# Patient Record
Sex: Female | Born: 1935
Health system: Southern US, Community
[De-identification: ages and names within clinical notes are randomized; demographics above are authoritative.]

## PROBLEM LIST (undated history)

## (undated) DIAGNOSIS — M1990 Unspecified osteoarthritis, unspecified site: Secondary | ICD-10-CM

## (undated) DIAGNOSIS — H469 Unspecified optic neuritis: Secondary | ICD-10-CM

## (undated) DIAGNOSIS — R413 Other amnesia: Secondary | ICD-10-CM

## (undated) DIAGNOSIS — I10 Essential (primary) hypertension: Secondary | ICD-10-CM

## (undated) DIAGNOSIS — G35 Multiple sclerosis: Secondary | ICD-10-CM

## (undated) DIAGNOSIS — R42 Dizziness and giddiness: Secondary | ICD-10-CM

## (undated) DIAGNOSIS — R3915 Urgency of urination: Secondary | ICD-10-CM

## (undated) HISTORY — PX: JOINT REPLACEMENT: SHX530

## (undated) HISTORY — PX: TONSILLECTOMY: SUR1361

## (undated) HISTORY — PX: COLECTOMY: SHX59

## (undated) HISTORY — DX: Dizziness and giddiness: R42

## (undated) HISTORY — DX: Other amnesia: R41.3

## (undated) HISTORY — DX: Essential (primary) hypertension: I10

## (undated) HISTORY — DX: Multiple sclerosis: G35

## (undated) HISTORY — DX: Unspecified optic neuritis: H46.9

---

## 1971-01-22 HISTORY — PX: VAGINAL HYSTERECTOMY: SUR661

## 1978-09-22 HISTORY — PX: COLECTOMY: SHX59

## 1992-01-22 HISTORY — PX: KNEE ARTHROSCOPY: SHX127

## 1997-05-25 ENCOUNTER — Ambulatory Visit (HOSPITAL_COMMUNITY): Admission: RE | Admit: 1997-05-25 | Discharge: 1997-05-25 | Payer: Self-pay | Admitting: Obstetrics and Gynecology

## 1998-06-23 ENCOUNTER — Encounter: Payer: Self-pay | Admitting: Obstetrics and Gynecology

## 1998-06-23 ENCOUNTER — Ambulatory Visit (HOSPITAL_COMMUNITY): Admission: RE | Admit: 1998-06-23 | Discharge: 1998-06-23 | Payer: Self-pay | Admitting: Obstetrics and Gynecology

## 1999-06-26 ENCOUNTER — Encounter: Payer: Self-pay | Admitting: Obstetrics and Gynecology

## 1999-06-26 ENCOUNTER — Ambulatory Visit (HOSPITAL_COMMUNITY): Admission: RE | Admit: 1999-06-26 | Discharge: 1999-06-26 | Payer: Self-pay | Admitting: Obstetrics and Gynecology

## 2000-06-12 ENCOUNTER — Other Ambulatory Visit: Admission: RE | Admit: 2000-06-12 | Discharge: 2000-06-12 | Payer: Self-pay | Admitting: Obstetrics and Gynecology

## 2000-07-01 ENCOUNTER — Ambulatory Visit (HOSPITAL_COMMUNITY): Admission: RE | Admit: 2000-07-01 | Discharge: 2000-07-01 | Payer: Self-pay | Admitting: Obstetrics and Gynecology

## 2000-07-01 ENCOUNTER — Encounter: Payer: Self-pay | Admitting: Obstetrics and Gynecology

## 2001-02-11 ENCOUNTER — Inpatient Hospital Stay (HOSPITAL_COMMUNITY): Admission: AC | Admit: 2001-02-11 | Discharge: 2001-02-17 | Payer: Self-pay

## 2001-02-11 ENCOUNTER — Encounter: Payer: Self-pay | Admitting: Emergency Medicine

## 2001-02-11 ENCOUNTER — Encounter: Payer: Self-pay | Admitting: General Surgery

## 2001-02-12 ENCOUNTER — Encounter: Payer: Self-pay | Admitting: General Surgery

## 2001-07-06 ENCOUNTER — Ambulatory Visit (HOSPITAL_COMMUNITY): Admission: RE | Admit: 2001-07-06 | Discharge: 2001-07-06 | Payer: Self-pay | Admitting: Obstetrics and Gynecology

## 2001-07-06 ENCOUNTER — Encounter: Payer: Self-pay | Admitting: Obstetrics and Gynecology

## 2002-07-08 ENCOUNTER — Encounter: Payer: Self-pay | Admitting: Obstetrics and Gynecology

## 2002-07-08 ENCOUNTER — Ambulatory Visit (HOSPITAL_COMMUNITY): Admission: RE | Admit: 2002-07-08 | Discharge: 2002-07-08 | Payer: Self-pay | Admitting: Obstetrics and Gynecology

## 2003-07-28 ENCOUNTER — Ambulatory Visit (HOSPITAL_COMMUNITY): Admission: RE | Admit: 2003-07-28 | Discharge: 2003-07-28 | Payer: Self-pay | Admitting: Obstetrics and Gynecology

## 2004-03-28 ENCOUNTER — Ambulatory Visit: Payer: Self-pay | Admitting: Hematology and Oncology

## 2004-05-30 ENCOUNTER — Ambulatory Visit (HOSPITAL_COMMUNITY): Admission: RE | Admit: 2004-05-30 | Discharge: 2004-05-30 | Payer: Self-pay | Admitting: *Deleted

## 2004-08-15 ENCOUNTER — Ambulatory Visit (HOSPITAL_COMMUNITY): Admission: RE | Admit: 2004-08-15 | Discharge: 2004-08-15 | Payer: Self-pay | Admitting: Obstetrics and Gynecology

## 2005-08-19 ENCOUNTER — Ambulatory Visit (HOSPITAL_COMMUNITY): Admission: RE | Admit: 2005-08-19 | Discharge: 2005-08-19 | Payer: Self-pay | Admitting: Internal Medicine

## 2006-08-22 ENCOUNTER — Ambulatory Visit (HOSPITAL_COMMUNITY): Admission: RE | Admit: 2006-08-22 | Discharge: 2006-08-22 | Payer: Self-pay | Admitting: Internal Medicine

## 2007-02-26 ENCOUNTER — Encounter: Admission: RE | Admit: 2007-02-26 | Discharge: 2007-02-26 | Payer: Self-pay | Admitting: Internal Medicine

## 2007-09-04 ENCOUNTER — Ambulatory Visit (HOSPITAL_COMMUNITY): Admission: RE | Admit: 2007-09-04 | Discharge: 2007-09-04 | Payer: Self-pay | Admitting: Internal Medicine

## 2007-11-26 ENCOUNTER — Encounter: Admission: RE | Admit: 2007-11-26 | Discharge: 2007-11-26 | Payer: Self-pay | Admitting: Internal Medicine

## 2008-04-04 ENCOUNTER — Encounter (INDEPENDENT_AMBULATORY_CARE_PROVIDER_SITE_OTHER): Payer: Self-pay | Admitting: *Deleted

## 2008-04-04 ENCOUNTER — Ambulatory Visit (HOSPITAL_COMMUNITY): Admission: RE | Admit: 2008-04-04 | Discharge: 2008-04-04 | Payer: Self-pay | Admitting: *Deleted

## 2008-05-25 ENCOUNTER — Encounter: Admission: RE | Admit: 2008-05-25 | Discharge: 2008-05-25 | Payer: Self-pay | Admitting: Internal Medicine

## 2008-06-13 ENCOUNTER — Encounter: Admission: RE | Admit: 2008-06-13 | Discharge: 2008-06-13 | Payer: Self-pay | Admitting: *Deleted

## 2008-09-05 ENCOUNTER — Ambulatory Visit (HOSPITAL_COMMUNITY): Admission: RE | Admit: 2008-09-05 | Discharge: 2008-09-05 | Payer: Self-pay | Admitting: Internal Medicine

## 2009-03-02 ENCOUNTER — Other Ambulatory Visit: Admission: RE | Admit: 2009-03-02 | Discharge: 2009-03-02 | Payer: Self-pay | Admitting: Internal Medicine

## 2009-09-08 ENCOUNTER — Ambulatory Visit (HOSPITAL_COMMUNITY): Admission: RE | Admit: 2009-09-08 | Discharge: 2009-09-08 | Payer: Self-pay | Admitting: Internal Medicine

## 2010-01-21 HISTORY — PX: KNEE ARTHROSCOPY: SHX127

## 2010-06-05 NOTE — Op Note (Signed)
Jennifer Atkins, LAPRISE NO.:  [id number]   MEDICAL RECORD NO.:  [id number]          PATIENT TYPE:  AMB   LOCATION:  ENDO                         FACILITY:  [facility]   PHYSICIAN:  Georgiana Spinner, M.D.    DATE OF BIRTH:  1934-04-30   DATE OF PROCEDURE:  04/04/2008  DATE OF DISCHARGE:                               OPERATIVE REPORT   PROCEDURE:  Flexible sigmoidoscopy with biopsy.   INDICATIONS:  Rectal bleeding.   ANESTHESIA:  None given.   PROCEDURE:  With the patient mildly sedated in the left lateral  decubitus position. The Pentax videoscopic colonoscope was inserted in  the rectum and passed under direct vision to approximately 25 cm from  anal verge at which point we stopped.  The patient had previously agreed  to only do a flexible sigmoidoscopy.  From this point the colonoscope  was slowly withdrawn, taking circumferential views of colonic mucosa,  stopping in the rectum where a polyp was seen, photographed and removed  using biopsy forceps technique.  It appeared that we removed the whole  tissue. At this point the endoscope was then placed in retroflexion to  view the anal canal from above. Hemorrhoids were noted.  The endoscope  was straightened and withdrawn.  The patient's vital signs, pulse  oximeter remained stable.  The patient tolerated procedure well without  apparent complication.   FINDINGS:  Hemorrhoids and polyp in the rectum.  Await biopsy report.  The patient will call me for results and follow-up with me as an  outpatient.  Barium enema precluded because of biopsy.  We discussed  this with the patient during the procedure. She was awake without  sedation.           ______________________________  Georgiana Spinner, M.D.     GMO/MEDQ  D:  04/04/2008  T:  04/04/2008  Job:  [id number]

## 2010-06-08 NOTE — Op Note (Signed)
Jennifer Atkins, TEEHAN NO.:  [id number]   MEDICAL RECORD NO.:  [id number]          PATIENT TYPE:  AMB   LOCATION:  ENDO                         FACILITY:  [facility]   PHYSICIAN:  Georgiana Spinner, M.D.    DATE OF BIRTH:  March 04, 1934   DATE OF PROCEDURE:  05/30/2004  DATE OF DISCHARGE:                                 OPERATIVE REPORT   PROCEDURES:  Flexible sigmoidoscopy.   INDICATIONS:  Colon cancer screening.   SEDATION:  No sedation was given at the patient's request   DESCRIPTION OF PROCEDURE:  With the patient in the left lateral decubitus  position, the Olympus videoscopic colonoscope was inserted in the rectum and  passed under direct vision to approximately 50 cm from anal verge.  We could  see proximally a distance and no lesions were noted. From this point, the  colonoscope was slowly withdrawn taking circumferential views of colonic  mucosa; stopping in the rectum, which appeared normal on direct and showed  hemorrhoids on retroflexed view.  The endoscope was straightened and  withdrawn. The patient's vital signs, pulse oximetry remained stable. The  patient tolerated procedure well without apparent complication.   FINDINGS:  Internal hemorrhoids.  Rare diverticulum of sigmoid colon;  otherwise an unremarkable examination.   PLAN:  We discussed colonoscopy, which the patient absolutely refuses,  versus barium enema versus doing nothing.  The patient has agreed to proceed  with barium enema to look at the rest of the colon.      GMO/MEDQ  D:  05/30/2004  T:  05/30/2004  Job:  [id number]
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
## 2010-08-07 ENCOUNTER — Other Ambulatory Visit (HOSPITAL_COMMUNITY): Payer: Self-pay | Admitting: Internal Medicine

## 2010-08-07 DIAGNOSIS — Z1231 Encounter for screening mammogram for malignant neoplasm of breast: Secondary | ICD-10-CM

## 2010-09-14 ENCOUNTER — Ambulatory Visit (HOSPITAL_COMMUNITY)
Admission: RE | Admit: 2010-09-14 | Discharge: 2010-09-14 | Disposition: A | Discharge status: Home / Self Care | Payer: Medicare Other | Source: Ambulatory Visit | Attending: Internal Medicine | Admitting: Internal Medicine

## 2010-09-14 DIAGNOSIS — Z1231 Encounter for screening mammogram for malignant neoplasm of breast: Secondary | ICD-10-CM | POA: Insufficient documentation

## 2011-01-22 HISTORY — PX: CATARACT EXTRACTION W/ INTRAOCULAR LENS  IMPLANT, BILATERAL: SHX1307

## 2011-01-31 DIAGNOSIS — I10 Essential (primary) hypertension: Secondary | ICD-10-CM | POA: Diagnosis not present

## 2011-03-28 DIAGNOSIS — R350 Frequency of micturition: Secondary | ICD-10-CM | POA: Diagnosis not present

## 2011-03-28 DIAGNOSIS — I10 Essential (primary) hypertension: Secondary | ICD-10-CM | POA: Diagnosis not present

## 2011-03-28 DIAGNOSIS — Z7902 Long term (current) use of antithrombotics/antiplatelets: Secondary | ICD-10-CM | POA: Diagnosis not present

## 2011-04-03 DIAGNOSIS — R350 Frequency of micturition: Secondary | ICD-10-CM | POA: Diagnosis not present

## 2011-04-03 DIAGNOSIS — E042 Nontoxic multinodular goiter: Secondary | ICD-10-CM | POA: Diagnosis not present

## 2011-04-03 DIAGNOSIS — I10 Essential (primary) hypertension: Secondary | ICD-10-CM | POA: Diagnosis not present

## 2011-05-15 DIAGNOSIS — R350 Frequency of micturition: Secondary | ICD-10-CM | POA: Diagnosis not present

## 2011-05-15 DIAGNOSIS — I10 Essential (primary) hypertension: Secondary | ICD-10-CM | POA: Diagnosis not present

## 2011-08-15 ENCOUNTER — Other Ambulatory Visit (HOSPITAL_COMMUNITY): Payer: Self-pay | Admitting: Internal Medicine

## 2011-08-15 DIAGNOSIS — Z1231 Encounter for screening mammogram for malignant neoplasm of breast: Secondary | ICD-10-CM

## 2011-09-09 DIAGNOSIS — H2510 Age-related nuclear cataract, unspecified eye: Secondary | ICD-10-CM | POA: Diagnosis not present

## 2011-09-18 ENCOUNTER — Ambulatory Visit (HOSPITAL_COMMUNITY)
Admission: RE | Admit: 2011-09-18 | Discharge: 2011-09-18 | Disposition: A | Discharge status: Home / Self Care | Payer: Medicare Other | Source: Ambulatory Visit | Attending: Internal Medicine | Admitting: Internal Medicine

## 2011-09-18 DIAGNOSIS — Z1231 Encounter for screening mammogram for malignant neoplasm of breast: Secondary | ICD-10-CM | POA: Diagnosis not present

## 2011-09-25 DIAGNOSIS — H2589 Other age-related cataract: Secondary | ICD-10-CM | POA: Diagnosis not present

## 2011-09-25 DIAGNOSIS — H2510 Age-related nuclear cataract, unspecified eye: Secondary | ICD-10-CM | POA: Diagnosis not present

## 2011-10-02 DIAGNOSIS — H2510 Age-related nuclear cataract, unspecified eye: Secondary | ICD-10-CM | POA: Diagnosis not present

## 2011-10-02 DIAGNOSIS — H2589 Other age-related cataract: Secondary | ICD-10-CM | POA: Diagnosis not present

## 2011-10-18 DIAGNOSIS — Z23 Encounter for immunization: Secondary | ICD-10-CM | POA: Diagnosis not present

## 2011-12-05 DIAGNOSIS — G35 Multiple sclerosis: Secondary | ICD-10-CM | POA: Diagnosis not present

## 2011-12-05 DIAGNOSIS — R269 Unspecified abnormalities of gait and mobility: Secondary | ICD-10-CM | POA: Diagnosis not present

## 2012-02-19 DIAGNOSIS — D485 Neoplasm of uncertain behavior of skin: Secondary | ICD-10-CM | POA: Diagnosis not present

## 2012-02-19 DIAGNOSIS — D1801 Hemangioma of skin and subcutaneous tissue: Secondary | ICD-10-CM | POA: Diagnosis not present

## 2012-02-19 DIAGNOSIS — D235 Other benign neoplasm of skin of trunk: Secondary | ICD-10-CM | POA: Diagnosis not present

## 2012-04-14 DIAGNOSIS — I10 Essential (primary) hypertension: Secondary | ICD-10-CM | POA: Diagnosis not present

## 2012-04-14 DIAGNOSIS — Z0000 Encounter for general adult medical examination without abnormal findings: Secondary | ICD-10-CM | POA: Diagnosis not present

## 2012-04-16 DIAGNOSIS — I10 Essential (primary) hypertension: Secondary | ICD-10-CM | POA: Diagnosis not present

## 2012-04-16 DIAGNOSIS — E042 Nontoxic multinodular goiter: Secondary | ICD-10-CM | POA: Diagnosis not present

## 2012-04-16 DIAGNOSIS — Z1212 Encounter for screening for malignant neoplasm of rectum: Secondary | ICD-10-CM | POA: Diagnosis not present

## 2012-04-16 DIAGNOSIS — R350 Frequency of micturition: Secondary | ICD-10-CM | POA: Diagnosis not present

## 2012-04-16 DIAGNOSIS — Z0000 Encounter for general adult medical examination without abnormal findings: Secondary | ICD-10-CM | POA: Diagnosis not present

## 2012-04-16 DIAGNOSIS — M899 Disorder of bone, unspecified: Secondary | ICD-10-CM | POA: Diagnosis not present

## 2012-04-16 DIAGNOSIS — G35 Multiple sclerosis: Secondary | ICD-10-CM | POA: Diagnosis not present

## 2012-04-16 DIAGNOSIS — M949 Disorder of cartilage, unspecified: Secondary | ICD-10-CM | POA: Diagnosis not present

## 2012-06-01 ENCOUNTER — Encounter: Payer: Self-pay | Admitting: Nurse Practitioner

## 2012-06-01 ENCOUNTER — Ambulatory Visit (INDEPENDENT_AMBULATORY_CARE_PROVIDER_SITE_OTHER): Payer: Medicare Other | Admitting: Nurse Practitioner

## 2012-06-01 VITALS — BP 148/82 | HR 69 | Ht 65.0 in | Wt 154.0 lb

## 2012-06-01 DIAGNOSIS — R269 Unspecified abnormalities of gait and mobility: Secondary | ICD-10-CM | POA: Diagnosis not present

## 2012-06-01 DIAGNOSIS — G35 Multiple sclerosis: Secondary | ICD-10-CM

## 2012-06-01 NOTE — Patient Instructions (Addendum)
Continue exercise program F/U in 1 year. Will be assigned to Dr. Terrace Arabia

## 2012-06-01 NOTE — Progress Notes (Signed)
HPI: Patient returns for followup after last visit with Dr. Sandria Manly 12/05/2011. She has a history of spinal cord multiple sclerosis characterized by symptoms from the conus medullaris and  right optic neuritis beginning in 1980. She has never been on immunomodulating medications. She continues to be very active with water aerobics, plays golf twice a week and walks most days. She denies any focal weakness or sensory changes, loss of bowel  control,  spasticity or incoordination. She denies any visual disturbance, speech or swallowing problems, any new sensory changes. She does have urinary frequency/incontinence and she is on VESIcare. She has had one fall in 6 months while putting something in the trunk of her car. She continues to have numbness in the right foot which is not changed. She claims her memory is stable, no new neurologic complaints  ROS:  - fatigue, numbness, urination problems, incontinence  Physical Exam General: well developed, well nourished, seated, in no evident distress Head: head normocephalic and atraumatic. Oropharynx benign Neck: supple with no carotid or supraclavicular bruits Cardiovascular: regular rate and rhythm, no murmurs  Neurologic Exam Mental Status: Awake and fully alert. Oriented to place and time. Follows all commands . Speech and language are normal Mood and affect appropriate.  Cranial Nerves: Status post cataract surgery bilaterally, no optic atrophy  Extraocular movements full without nystagmus. Visual fields full to confrontation. Hearing intact and symmetric to finger snap. Facial sensation intact. Face, tongue, palate move normally and symmetrically. Neck flexion and extension normal.  Motor: Normal bulk and tone. Normal strength in all tested extremity muscles. Sensory.: intact to touch and pinprick and vibratory except decreased sensation in the right foot as compared to the left.  Coordination: Rapid alternating movements normal in all extremities.  Finger-to-nose and heel-to-shin performed accurately bilaterally. Gait and Station: Arises from chair without difficulty. Stance is normal. Gait demonstrates normal stride length and balance . Able to heel, toe and slight difficulty with tandem walk. No assistive device Reflexes: 2+ and symmetric. Toes downgoing.     ASSESSMENT: Multiple sclerosis with history of right optic neuritis in 1980 and history of vertigo. Patient has never been on immune modulating medications     PLAN: Patient to be assigned to Dr. Terrace Arabia She will followup yearly and as needed She will continue her current exercise regimen  Nilda Riggs, GNP-BC APRN

## 2012-06-03 DIAGNOSIS — M949 Disorder of cartilage, unspecified: Secondary | ICD-10-CM | POA: Diagnosis not present

## 2012-08-26 ENCOUNTER — Other Ambulatory Visit (HOSPITAL_COMMUNITY): Payer: Self-pay | Admitting: Internal Medicine

## 2012-08-26 DIAGNOSIS — Z1231 Encounter for screening mammogram for malignant neoplasm of breast: Secondary | ICD-10-CM

## 2012-09-23 ENCOUNTER — Ambulatory Visit (HOSPITAL_COMMUNITY)
Admission: RE | Admit: 2012-09-23 | Discharge: 2012-09-23 | Disposition: A | Discharge status: Home / Self Care | Payer: Medicare Other | Source: Ambulatory Visit | Attending: Internal Medicine | Admitting: Internal Medicine

## 2012-09-23 DIAGNOSIS — Z1231 Encounter for screening mammogram for malignant neoplasm of breast: Secondary | ICD-10-CM | POA: Insufficient documentation

## 2012-09-23 DIAGNOSIS — W19XXXA Unspecified fall, initial encounter: Secondary | ICD-10-CM | POA: Diagnosis not present

## 2012-09-23 DIAGNOSIS — Z0389 Encounter for observation for other suspected diseases and conditions ruled out: Secondary | ICD-10-CM | POA: Diagnosis not present

## 2012-10-08 DIAGNOSIS — Z23 Encounter for immunization: Secondary | ICD-10-CM | POA: Diagnosis not present

## 2012-10-08 DIAGNOSIS — H9209 Otalgia, unspecified ear: Secondary | ICD-10-CM | POA: Diagnosis not present

## 2012-10-09 DIAGNOSIS — H9209 Otalgia, unspecified ear: Secondary | ICD-10-CM | POA: Diagnosis not present

## 2012-10-09 DIAGNOSIS — H6120 Impacted cerumen, unspecified ear: Secondary | ICD-10-CM | POA: Diagnosis not present

## 2012-11-23 DIAGNOSIS — I10 Essential (primary) hypertension: Secondary | ICD-10-CM | POA: Diagnosis not present

## 2012-11-23 DIAGNOSIS — H18419 Arcus senilis, unspecified eye: Secondary | ICD-10-CM | POA: Diagnosis not present

## 2012-11-23 DIAGNOSIS — Z961 Presence of intraocular lens: Secondary | ICD-10-CM | POA: Diagnosis not present

## 2012-11-23 DIAGNOSIS — H04129 Dry eye syndrome of unspecified lacrimal gland: Secondary | ICD-10-CM | POA: Diagnosis not present

## 2012-12-30 DIAGNOSIS — D235 Other benign neoplasm of skin of trunk: Secondary | ICD-10-CM | POA: Diagnosis not present

## 2012-12-30 DIAGNOSIS — L570 Actinic keratosis: Secondary | ICD-10-CM | POA: Diagnosis not present

## 2013-02-19 ENCOUNTER — Encounter (HOSPITAL_BASED_OUTPATIENT_CLINIC_OR_DEPARTMENT_OTHER): Payer: Self-pay | Admitting: Emergency Medicine

## 2013-02-19 ENCOUNTER — Emergency Department (HOSPITAL_BASED_OUTPATIENT_CLINIC_OR_DEPARTMENT_OTHER): Payer: Medicare Other

## 2013-02-19 ENCOUNTER — Emergency Department (HOSPITAL_BASED_OUTPATIENT_CLINIC_OR_DEPARTMENT_OTHER)
Admission: EM | Admit: 2013-02-19 | Discharge: 2013-02-19 | Disposition: A | Discharge status: Home / Self Care | Payer: Medicare Other | Attending: Emergency Medicine | Admitting: Emergency Medicine

## 2013-02-19 DIAGNOSIS — Z8781 Personal history of (healed) traumatic fracture: Secondary | ICD-10-CM | POA: Insufficient documentation

## 2013-02-19 DIAGNOSIS — Z79899 Other long term (current) drug therapy: Secondary | ICD-10-CM | POA: Diagnosis not present

## 2013-02-19 DIAGNOSIS — I10 Essential (primary) hypertension: Secondary | ICD-10-CM | POA: Diagnosis not present

## 2013-02-19 DIAGNOSIS — S0100XA Unspecified open wound of scalp, initial encounter: Secondary | ICD-10-CM | POA: Diagnosis not present

## 2013-02-19 DIAGNOSIS — S0990XA Unspecified injury of head, initial encounter: Secondary | ICD-10-CM | POA: Diagnosis not present

## 2013-02-19 DIAGNOSIS — S79929A Unspecified injury of unspecified thigh, initial encounter: Secondary | ICD-10-CM

## 2013-02-19 DIAGNOSIS — IMO0002 Reserved for concepts with insufficient information to code with codable children: Secondary | ICD-10-CM | POA: Insufficient documentation

## 2013-02-19 DIAGNOSIS — S79919A Unspecified injury of unspecified hip, initial encounter: Secondary | ICD-10-CM | POA: Insufficient documentation

## 2013-02-19 DIAGNOSIS — Z8669 Personal history of other diseases of the nervous system and sense organs: Secondary | ICD-10-CM | POA: Diagnosis not present

## 2013-02-19 DIAGNOSIS — S199XXA Unspecified injury of neck, initial encounter: Secondary | ICD-10-CM | POA: Diagnosis not present

## 2013-02-19 DIAGNOSIS — M542 Cervicalgia: Secondary | ICD-10-CM | POA: Diagnosis not present

## 2013-02-19 DIAGNOSIS — Y939 Activity, unspecified: Secondary | ICD-10-CM | POA: Insufficient documentation

## 2013-02-19 DIAGNOSIS — W19XXXA Unspecified fall, initial encounter: Secondary | ICD-10-CM

## 2013-02-19 DIAGNOSIS — S0101XA Laceration without foreign body of scalp, initial encounter: Secondary | ICD-10-CM

## 2013-02-19 DIAGNOSIS — S0081XA Abrasion of other part of head, initial encounter: Secondary | ICD-10-CM

## 2013-02-19 DIAGNOSIS — Y929 Unspecified place or not applicable: Secondary | ICD-10-CM | POA: Insufficient documentation

## 2013-02-19 DIAGNOSIS — W010XXA Fall on same level from slipping, tripping and stumbling without subsequent striking against object, initial encounter: Secondary | ICD-10-CM | POA: Insufficient documentation

## 2013-02-19 DIAGNOSIS — M25559 Pain in unspecified hip: Secondary | ICD-10-CM | POA: Diagnosis not present

## 2013-02-19 DIAGNOSIS — S0993XA Unspecified injury of face, initial encounter: Secondary | ICD-10-CM | POA: Diagnosis not present

## 2013-02-19 DIAGNOSIS — W1809XA Striking against other object with subsequent fall, initial encounter: Secondary | ICD-10-CM | POA: Insufficient documentation

## 2013-02-19 NOTE — ED Notes (Signed)
Pt sts she tripped in her kitchen and fell hitting head on the floor. Pt denies loc, denies neck and back pain. Pt has laceration to left forehead, bleeding controlled at present. Pt denies cp, shob, dizziness.

## 2013-02-19 NOTE — ED Provider Notes (Signed)
CSN: [id number]     Arrival date & time 02/19/13  1152 History   First MD Initiated Contact with Patient 02/19/13 1210     Chief Complaint  Patient presents with  . Fall  . Head Injury   (Consider location/radiation/quality/duration/timing/severity/associated sxs/prior Treatment) Patient is a 79 y.o. female presenting with fall and head injury. The history is provided by the patient.  Fall Associated symptoms include headaches. Pertinent negatives include no chest pain, no abdominal pain and no shortness of breath.  Head Injury Associated symptoms: headache    patient does stumbled in her kitchen falling on the hard tile floor. No loss of consciousness. Patient landed on her left side struck the left side of her head and face. Denies any neck or back pain but is complaining of some mild left hip pain. Patient with a laceration to the left fore head scalp area. Patient denies any chest pain shortness of breath dizziness or history of syncope. Patient with history of an old pelvic fracture from a fall several years ago. Patient has a history of MS.  Past Medical History  Diagnosis Date  . MS (multiple sclerosis)   . Optic neuritis   . Vertigo   . Hypertension    Past Surgical History  Procedure Laterality Date  . Vaginal hysterectomy    . Colon surgery    . Knee surgery Right   . Cataracts     Family History  Problem Relation Age of Onset  . Cancer Father   . Leukemia Mother   . Heart disease    . Heart disease Maternal Grandmother    History  Substance Use Topics  . Smoking status: Never Smoker   . Smokeless tobacco: Never Used  . Alcohol Use: Yes     Comment: wine daily   OB History   Grav Para Term Preterm Abortions TAB SAB Ect Mult Living                 Review of Systems  Constitutional: Negative for fever.  HENT: Negative for congestion.   Eyes: Negative for visual disturbance.  Respiratory: Negative for shortness of breath.   Cardiovascular: Negative for  chest pain.  Gastrointestinal: Negative for abdominal pain.  Genitourinary: Negative for dysuria.  Musculoskeletal: Negative for back pain.  Skin: Negative for rash.  Neurological: Positive for headaches. Negative for syncope and facial asymmetry.  Hematological: Does not bruise/bleed easily.  Psychiatric/Behavioral: Negative for confusion.    Allergies  Review of patient's allergies indicates no known allergies.  Home Medications   Current Outpatient Rx  Name  Route  Sig  Dispense  Refill  . Acetaminophen (TYLENOL ARTHRITIS PAIN PO)   Oral   Take by mouth as needed.         Marland Kitchen amLODipine (NORVASC) 2.5 MG tablet      2.5 mg daily.         Marland Kitchen BABY ASPIRIN PO   Oral   Take by mouth daily.         . Calcium Carbonate Antacid (TUMS PO)   Oral   Take by mouth 4 (four) times daily.         Marland Kitchen DIOVAN 320 MG tablet      320 mg daily.         . fish oil-omega-3 fatty acids 1000 MG capsule   Oral   Take 2 g by mouth 3 (three) times daily.         Marland Kitchen GLUCOSAMINE HCL PO  Oral   Take by mouth 2 (two) times daily.         . Multiple Vitamins-Minerals (MULTIVITAMIN PO)   Oral   Take by mouth daily.         . RESTASIS 0.05 % ophthalmic emulsion      0.05 drops 2 (two) times daily.         . VESICARE 5 MG tablet      5 mg daily.         Marland Kitchen VITAMIN D, CHOLECALCIFEROL, PO   Oral   Take by mouth 2 (two) times daily.          BP 175/97  Pulse 78  Temp(Src) 98.1 F (36.7 C) (Oral)  Resp 16  Ht 5' 5.5" (1.664 m)  Wt 150 lb (68.04 kg)  BMI 24.57 kg/m2  SpO2 98% Physical Exam  Nursing note and vitals reviewed. Constitutional: She is oriented to person, place, and time. She appears well-developed and well-nourished.  HENT:  Head: Normocephalic.  Left fore head temporal scalp area with a 1 cm laceration with bleeding controlled. Left cheek with an area of abrasion measuring about the 1 cm another area measuring about a half a centimeter.  Eyes:  Conjunctivae and EOM are normal. Pupils are equal, round, and reactive to light.  Neck: Normal range of motion.  Cardiovascular: Normal rate, regular rhythm and normal heart sounds.   Pulmonary/Chest: Effort normal and breath sounds normal. No respiratory distress.  Abdominal: Soft. Bowel sounds are normal. There is no tenderness.  Musculoskeletal: Normal range of motion. She exhibits tenderness.  Mild left hip tenderness.  Neurological: She is alert and oriented to person, place, and time. No cranial nerve deficit. She exhibits normal muscle tone. Coordination normal.  Skin: Skin is warm. No rash noted.    ED Course  LACERATION REPAIR Date/Time: 02/19/2013 2:28 PM Performed by: Mervin Kung. Authorized by: Mervin Kung Consent: Verbal consent obtained. Consent given by: patient Body area: head/neck Location details: scalp Laceration length: 1 cm Patient sedated: no Irrigation solution: saline Irrigation method: syringe Debridement: none Skin closure: staples Number of sutures: 2 Approximation difficulty: simple Patient tolerance: Patient tolerated the procedure well with no immediate complications.   (including critical care time) Labs Review Labs Reviewed - No data to display Imaging Review Dg Hip Bilateral W/pelvis  02/19/2013   CLINICAL DATA:  Fall with bilateral hip pain.  EXAM: BILATERAL HIP WITH PELVIS - 4+ VIEW  COMPARISON:  None.  FINDINGS: No acute fracture or dislocation is identified involving both hips or the bony pelvis. Healed deformities of the superior and inferior pubic rami on the right are consistent with prior fracture. Mild osteoarthritis present in both hip joints. No bony lesions or destruction identified. Soft tissues are unremarkable.  IMPRESSION: No acute fracture identified. Healed fractures of the right superior and inferior pubic rami are identified.   Electronically Signed   By: Aletta Edouard M.D.   On: 02/19/2013 13:43   Ct Head Wo  Contrast  02/19/2013   CLINICAL DATA:  [place].  Lacerations on the left.  EXAM: CT HEAD WITHOUT CONTRAST  CT MAXILLOFACIAL WITHOUT CONTRAST  TECHNIQUE: Multidetector CT imaging of the head and maxillofacial structures were performed using the standard protocol without intravenous contrast. Multiplanar CT image reconstructions of the maxillofacial structures were also generated.  COMPARISON:  None.  FINDINGS: CT HEAD FINDINGS  The brain shows mild generalized age related atrophy. No evidence of acute infarction, mass lesion, hemorrhage, hydrocephalus or  extra-axial collection. Mega cisterna magna incidentally noted of no significance. No skull fracture. Scalp injury noted in the left frontal region. No fluid in the visualized sinuses, middle ears or mastoids.  CT MAXILLOFACIAL FINDINGS  No facial fracture. No evidence of acute dental injury. No fluid in the sinuses.  IMPRESSION: Negative CT scan of the face.  Left frontal scalp injury. No underlying skull fracture. No intracranial injury. Ordinary age related atrophy.   Electronically Signed   By: Nelson Chimes M.D.   On: 02/19/2013 13:48   Ct Cervical Spine Wo Contrast  02/19/2013   CLINICAL DATA:  NECK PAIN SECONDARY TO A FALL. LACERATIONS TO THE FACE.  EXAM: CT CERVICAL SPINE WITHOUT CONTRAST  TECHNIQUE: Multidetector CT imaging of the cervical spine was performed without intravenous contrast. Multiplanar CT image reconstructions were also generated.  COMPARISON:  None.  FINDINGS: There is no fracture, subluxation, prevertebral soft tissue swelling, or other acute abnormality. The patient has moderate degenerative disc disease at C4-5, C5-6, and C6-7 with right facet arthritis at the same levels.  IMPRESSION: No acute abnormalities of the cervical spine.   Electronically Signed   By: Rozetta Nunnery M.D.   On: 02/19/2013 13:43   Ct Maxillofacial Wo Cm  02/19/2013   CLINICAL DATA:  [place].  Lacerations on the left.  EXAM: CT HEAD WITHOUT CONTRAST  CT MAXILLOFACIAL  WITHOUT CONTRAST  TECHNIQUE: Multidetector CT imaging of the head and maxillofacial structures were performed using the standard protocol without intravenous contrast. Multiplanar CT image reconstructions of the maxillofacial structures were also generated.  COMPARISON:  None.  FINDINGS: CT HEAD FINDINGS  The brain shows mild generalized age related atrophy. No evidence of acute infarction, mass lesion, hemorrhage, hydrocephalus or extra-axial collection. Mega cisterna magna incidentally noted of no significance. No skull fracture. Scalp injury noted in the left frontal region. No fluid in the visualized sinuses, middle ears or mastoids.  CT MAXILLOFACIAL FINDINGS  No facial fracture. No evidence of acute dental injury. No fluid in the sinuses.  IMPRESSION: Negative CT scan of the face.  Left frontal scalp injury. No underlying skull fracture. No intracranial injury. Ordinary age related atrophy.   Electronically Signed   By: Nelson Chimes M.D.   On: 02/19/2013 13:48    EKG Interpretation   None       MDM   1. Fall   2. Head injury   3. Scalp laceration   4. Facial abrasion    Patient status post fall no syncope patient stumbled. Landed on the left sided head and face. No loss of consciousness. Head CT CT of neck CT of face and x-rays of the pelvis and hips all negative for acute injuries. Patient with a 1 cm laceration left fore head scalp area. Scalp wound was closed with 2 surgical staples. There is also an abrasion to the left cheek area that was cleaned up and will heal on its own. Patient will return or followup with her doctor for staple removal in not 5-7 days.  Mervin Kung, MD 02/19/13 [phone]

## 2013-02-19 NOTE — Discharge Instructions (Signed)
Staple removal in not 5-7 days. Keep the scalp wound clean and dry for 24 hours and can shower as regular. Wash the facial abrasion with soap and water daily. After 24 hours can wash the scalp with soap and water daily. Return for any signs of infection they are highly unlikely that the face and scalp. CT scan of your head face neck and x-rays of your pelvis were negative for any acute injuries.

## 2013-02-24 DIAGNOSIS — H26499 Other secondary cataract, unspecified eye: Secondary | ICD-10-CM | POA: Diagnosis not present

## 2013-02-24 DIAGNOSIS — Z961 Presence of intraocular lens: Secondary | ICD-10-CM | POA: Diagnosis not present

## 2013-02-24 DIAGNOSIS — H1130 Conjunctival hemorrhage, unspecified eye: Secondary | ICD-10-CM | POA: Diagnosis not present

## 2013-02-25 DIAGNOSIS — T148XXA Other injury of unspecified body region, initial encounter: Secondary | ICD-10-CM | POA: Diagnosis not present

## 2013-02-25 DIAGNOSIS — R29818 Other symptoms and signs involving the nervous system: Secondary | ICD-10-CM | POA: Diagnosis not present

## 2013-03-03 DIAGNOSIS — R269 Unspecified abnormalities of gait and mobility: Secondary | ICD-10-CM | POA: Diagnosis not present

## 2013-03-16 DIAGNOSIS — R269 Unspecified abnormalities of gait and mobility: Secondary | ICD-10-CM | POA: Diagnosis not present

## 2013-03-19 DIAGNOSIS — R269 Unspecified abnormalities of gait and mobility: Secondary | ICD-10-CM | POA: Diagnosis not present

## 2013-03-24 DIAGNOSIS — R269 Unspecified abnormalities of gait and mobility: Secondary | ICD-10-CM | POA: Diagnosis not present

## 2013-03-26 DIAGNOSIS — R269 Unspecified abnormalities of gait and mobility: Secondary | ICD-10-CM | POA: Diagnosis not present

## 2013-03-30 DIAGNOSIS — R269 Unspecified abnormalities of gait and mobility: Secondary | ICD-10-CM | POA: Diagnosis not present

## 2013-04-01 DIAGNOSIS — R269 Unspecified abnormalities of gait and mobility: Secondary | ICD-10-CM | POA: Diagnosis not present

## 2013-04-06 DIAGNOSIS — R269 Unspecified abnormalities of gait and mobility: Secondary | ICD-10-CM | POA: Diagnosis not present

## 2013-04-08 DIAGNOSIS — R269 Unspecified abnormalities of gait and mobility: Secondary | ICD-10-CM | POA: Diagnosis not present

## 2013-04-16 DIAGNOSIS — R269 Unspecified abnormalities of gait and mobility: Secondary | ICD-10-CM | POA: Diagnosis not present

## 2013-04-19 DIAGNOSIS — Z23 Encounter for immunization: Secondary | ICD-10-CM | POA: Diagnosis not present

## 2013-04-19 DIAGNOSIS — Z0000 Encounter for general adult medical examination without abnormal findings: Secondary | ICD-10-CM | POA: Diagnosis not present

## 2013-04-19 DIAGNOSIS — E7800 Pure hypercholesterolemia, unspecified: Secondary | ICD-10-CM | POA: Diagnosis not present

## 2013-04-19 DIAGNOSIS — I10 Essential (primary) hypertension: Secondary | ICD-10-CM | POA: Diagnosis not present

## 2013-04-22 DIAGNOSIS — G35 Multiple sclerosis: Secondary | ICD-10-CM | POA: Diagnosis not present

## 2013-04-22 DIAGNOSIS — E042 Nontoxic multinodular goiter: Secondary | ICD-10-CM | POA: Diagnosis not present

## 2013-04-22 DIAGNOSIS — I10 Essential (primary) hypertension: Secondary | ICD-10-CM | POA: Diagnosis not present

## 2013-04-22 DIAGNOSIS — Z1212 Encounter for screening for malignant neoplasm of rectum: Secondary | ICD-10-CM | POA: Diagnosis not present

## 2013-04-22 DIAGNOSIS — R350 Frequency of micturition: Secondary | ICD-10-CM | POA: Diagnosis not present

## 2013-04-22 DIAGNOSIS — E7800 Pure hypercholesterolemia, unspecified: Secondary | ICD-10-CM | POA: Diagnosis not present

## 2013-04-22 DIAGNOSIS — M949 Disorder of cartilage, unspecified: Secondary | ICD-10-CM | POA: Diagnosis not present

## 2013-04-22 DIAGNOSIS — Z7982 Long term (current) use of aspirin: Secondary | ICD-10-CM | POA: Diagnosis not present

## 2013-04-22 DIAGNOSIS — M899 Disorder of bone, unspecified: Secondary | ICD-10-CM | POA: Diagnosis not present

## 2013-05-31 ENCOUNTER — Ambulatory Visit (INDEPENDENT_AMBULATORY_CARE_PROVIDER_SITE_OTHER): Payer: Medicare Other | Admitting: Neurology

## 2013-05-31 ENCOUNTER — Encounter: Payer: Self-pay | Admitting: Neurology

## 2013-05-31 ENCOUNTER — Encounter (INDEPENDENT_AMBULATORY_CARE_PROVIDER_SITE_OTHER): Payer: Self-pay

## 2013-05-31 VITALS — BP 145/82 | HR 67 | Ht 65.0 in | Wt 154.0 lb

## 2013-05-31 DIAGNOSIS — G35 Multiple sclerosis: Secondary | ICD-10-CM

## 2013-05-31 DIAGNOSIS — R269 Unspecified abnormalities of gait and mobility: Secondary | ICD-10-CM

## 2013-05-31 DIAGNOSIS — R209 Unspecified disturbances of skin sensation: Secondary | ICD-10-CM

## 2013-05-31 DIAGNOSIS — R200 Anesthesia of skin: Secondary | ICD-10-CM | POA: Insufficient documentation

## 2013-05-31 NOTE — Progress Notes (Signed)
PATIENT: Jennifer Atkins DOB: 08-07-1934  HISTORICAL  Jennifer Atkins is a 79 year old right-handed Caucasian female, followup for multiple sclerosis, her primary care physician is [facility] Jennifer Atkins, last clinical visit was with Jennifer Atkins in May 2014  She had past medical history of relapsing median multiple sclerosis, many many spinal cord, conus medullaris,and  right optic neuritis with symptoms beginning in 1980. She has never been on any immunomodulating medications.   She was highly functional, had gradual onset gait difficulty over the past one year,  She fell in Jan 2015, went to ED, had left forehead laceration, she fell in August 2014 at [facility].    She was referred to physical therapy afterwards, machine exercise has made her right leg worse, right foot pain.  She now complains of worsening right foot numbness, right knee pain, has right meniscus repair in the past, right leg numbness, weakness, she also has mild left foot numbness.   She has no visual loss, no weakness at both arms.   she also has worsening bladder incontinence, 1-2 episode of nocturia she every night   ROS: Fatigue, incontinence of bladder, numbness, bilateral knee pain, gait difficulty,  HOME MEDICATIONS: Current Outpatient Prescriptions on File Prior to Visit  Medication Sig Dispense Refill  . Acetaminophen (TYLENOL ARTHRITIS PAIN PO) Take by mouth as needed.      Marland Kitchen amLODipine (NORVASC) 2.5 MG tablet 2.5 mg daily.      Marland Kitchen BABY ASPIRIN PO Take by mouth daily.      . Calcium Carbonate Antacid (TUMS PO) Take by mouth 2 (two) times daily.       Marland Kitchen DIOVAN 320 MG tablet 320 mg daily.      . fish oil-omega-3 fatty acids 1000 MG capsule Take 2 g by mouth 3 (three) times daily.      Marland Kitchen GLUCOSAMINE HCL PO Take by mouth 2 (two) times daily.      . Multiple Vitamins-Minerals (MULTIVITAMIN PO) Take by mouth daily.      . RESTASIS 0.05 % ophthalmic emulsion 0.05 drops 2 (two) times daily.        . VESICARE 5 MG tablet 5 mg daily.      Marland Kitchen VITAMIN D, CHOLECALCIFEROL, PO Take by mouth 2 (two) times daily.       No current facility-administered medications on file prior to visit.    PAST MEDICAL HISTORY: Past Medical History  Diagnosis Date  . MS (multiple sclerosis)   . Optic neuritis   . Vertigo   . Hypertension     PAST SURGICAL HISTORY: Past Surgical History  Procedure Laterality Date  . Vaginal hysterectomy    . Colon surgery    . Knee surgery Right   . Cataracts Bilateral     FAMILY HISTORY: Family History  Problem Relation Age of Onset  . Cancer Father   . Leukemia Mother   . Heart disease    . Heart disease Maternal Grandmother     SOCIAL HISTORY:  History   Social History  . Marital Status: Widowed    Spouse Name: N/A    Number of Children: 6  . Years of Education: College   Occupational History  .      Retired   Social History Main Topics  . Smoking status: Never Smoker   . Smokeless tobacco: Never Used  . Alcohol Use: 0.5 oz/week    1 drink(s) per week     Comment: wine daily  . Drug Use:  No  . Sexual Activity: Not on file   Other Topics Concern  . Not on file   Social History Narrative   Patient is retired and has a Gaffer. She is a widow and has 6 children. She lives at the [place] at [facility].      PHYSICAL EXAM   Filed Vitals:   05/31/13 1343  BP: 145/82  Pulse: 67  Height: 5\' 5"  (1.651 m)  Weight: 154 lb (69.854 kg)    Not recorded    Body mass index is 25.63 kg/(m^2).   Generalized: In no acute distress  Neck: Supple, no carotid bruits   Cardiac: Regular rate rhythm  Pulmonary: Clear to auscultation bilaterally  Musculoskeletal: No deformity  Neurological examination  Mentation: Alert oriented to time, place, history taking, and causual conversation  Cranial nerve II-XII: Pupils were equal round reactive to light. Extraocular movements were full.  Visual field were full on confrontational  test. Bilateral fundi were sharp.  Facial sensation and strength were normal. Hearing was intact to finger rubbing bilaterally. Uvula tongue midline.  Head turning and shoulder shrug and were normal and symmetric.Tongue protrusion into cheek strength was normal.  Motor: Normal tone, bulk and strength.  Sensory: Intact to fine touch, pinprick, preserved vibratory sensation, and proprioception at toes.  Coordination: Normal finger to nose, heel-to-shin bilaterally there was no truncal ataxia  Gait: Rising up from seated position without assistance,  Bilateral valgrus knee, cautious, mildly unsteady Romberg signs: Negative  Deep tendon reflexes: Brachioradialis 3/3, biceps 3/3, triceps 3/3, patellar 3/3, Achilles 2/2, plantar responses were extensor bilaterally.   DIAGNOSTIC DATA (LABS, IMAGING, TESTING) - I reviewed patient records, labs, notes, testing and imaging myself where available.    ASSESSMENT AND PLAN  Jennifer Atkins is a 79 y.o. female carries the diagnosis of multiple sclerosis, had progressive worsening gait difficulty,urinary frequency, nocturia, over the past few months, She had hyperreflexia on exam, mild length dependent sensory changes,  1, differentiation including spinal cord lesions (cervical cord and thoracic cord lesions), in combination with lumbar spinal stenosis, peripheral neuropathy, 2. MRI neuroaxis 3. EMG/NCS.   Marcial Pacas, M.D. Ph.D.  [facility] [address], [place], [place] [address] [phone]

## 2013-06-09 ENCOUNTER — Ambulatory Visit
Admission: RE | Admit: 2013-06-09 | Discharge: 2013-06-09 | Disposition: A | Discharge status: Home / Self Care | Payer: Medicare Other | Source: Ambulatory Visit | Attending: Neurology | Admitting: Neurology

## 2013-06-09 DIAGNOSIS — R200 Anesthesia of skin: Secondary | ICD-10-CM

## 2013-06-09 DIAGNOSIS — R269 Unspecified abnormalities of gait and mobility: Secondary | ICD-10-CM

## 2013-06-09 DIAGNOSIS — G35 Multiple sclerosis: Secondary | ICD-10-CM | POA: Diagnosis not present

## 2013-06-09 MED ORDER — GADOBENATE DIMEGLUMINE 529 MG/ML IV SOLN
14.0000 mL | Freq: Once | INTRAVENOUS | Status: AC | PRN
  Administered 2013-06-09: 14 mL via INTRAVENOUS

## 2013-06-11 ENCOUNTER — Encounter (INDEPENDENT_AMBULATORY_CARE_PROVIDER_SITE_OTHER): Payer: Self-pay | Admitting: Radiology

## 2013-06-11 ENCOUNTER — Ambulatory Visit (INDEPENDENT_AMBULATORY_CARE_PROVIDER_SITE_OTHER): Payer: Medicare Other | Admitting: Neurology

## 2013-06-11 DIAGNOSIS — R209 Unspecified disturbances of skin sensation: Secondary | ICD-10-CM

## 2013-06-11 DIAGNOSIS — R269 Unspecified abnormalities of gait and mobility: Secondary | ICD-10-CM

## 2013-06-11 DIAGNOSIS — Z0289 Encounter for other administrative examinations: Secondary | ICD-10-CM

## 2013-06-11 DIAGNOSIS — G35 Multiple sclerosis: Secondary | ICD-10-CM

## 2013-06-11 DIAGNOSIS — R200 Anesthesia of skin: Secondary | ICD-10-CM

## 2013-06-11 NOTE — Procedures (Signed)
   NCS (NERVE CONDUCTION STUDY) WITH EMG (ELECTROMYOGRAPHY) REPORT   STUDY DATE: May 22nd 2015 PATIENT NAME: Jennifer Atkins DOB: 10/08/1934 MRN: [id number]    TECHNOLOGIST: Towana Badger ELECTROMYOGRAPHER: Marcial Pacas M.D.  CLINICAL INFORMATION:  79 year old Caucasian female, with chronic low back pain, bilateral lower extremity paresthesia, carry a diagnosis of possible multiple sclerosis  FINDINGS: NERVE CONDUCTION STUDY: Right sural, peroneal sensory responses were normal. Left sural sensory response was normal. Left peroneal sensory response was absent.  Bilateral peroneal to EDB, and tibial motor responses were normal. Bilateral tibial H. reflexes were normal and symmetric.  NEEDLE ELECTROMYOGRAPHY: Selected needle examination was performed at bilateral lower extremity muscles, and bilateral lumbosacral paraspinal muscles. Next  Bilateral tibialis anterior, tibialis posterior, vastus lateralis, right biceps femoris long head, normally insertion activity, no spontaneous activity, slightly enlarged, simple morphology motor unit potential, with mildly decreased recruitment patterns.  There was no spontaneous activity at bilateral lumbosacral paraspinal muscles, bilateral L4, L5, S1  IMPRESSION:   This is an abnormal study. There is electrodiagnostic evidence of left superficial peroneal neuropathy, only involving distal left superficial peroneal sensory branches, there was no evidence of motor branch enactment. There is no electrodiagnostic evidence of large fiber peripheral neuropathy.  There was mild chronic neuropathic changes indicating bilateral lumbosacral myotomes, mainly bilateral L4, L5, suggestive of mild chronic bilateral lumbosacral radiculopathies.  MRI of lumbar spine is present, she will return in June 2015 for followup    INTERPRETING PHYSICIAN:   Marcial Pacas M.D. Ph.D. [facility] [address] [place], [place] [address] [phone]

## 2013-06-16 ENCOUNTER — Ambulatory Visit
Admission: RE | Admit: 2013-06-16 | Discharge: 2013-06-16 | Disposition: A | Discharge status: Home / Self Care | Payer: Medicare Other | Source: Ambulatory Visit | Attending: Neurology | Admitting: Neurology

## 2013-06-16 ENCOUNTER — Telehealth: Payer: Self-pay | Admitting: Neurology

## 2013-06-16 DIAGNOSIS — G35 Multiple sclerosis: Secondary | ICD-10-CM | POA: Diagnosis not present

## 2013-06-16 DIAGNOSIS — R200 Anesthesia of skin: Secondary | ICD-10-CM

## 2013-06-16 DIAGNOSIS — R269 Unspecified abnormalities of gait and mobility: Secondary | ICD-10-CM

## 2013-06-16 MED ORDER — GADOBENATE DIMEGLUMINE 529 MG/ML IV SOLN
14.0000 mL | Freq: Once | INTRAVENOUS | Status: AC | PRN
  Administered 2013-06-16: 14 mL via INTRAVENOUS

## 2013-06-16 NOTE — Telephone Encounter (Signed)
Will go over MRI result on follow up visit in June 11th 2015.

## 2013-06-16 NOTE — Progress Notes (Signed)
Quick Note:  Spoke to patient and relayed MRI cervical results, per Dr. Krista Blue. ______

## 2013-07-01 ENCOUNTER — Ambulatory Visit (INDEPENDENT_AMBULATORY_CARE_PROVIDER_SITE_OTHER): Payer: Medicare Other | Admitting: Neurology

## 2013-07-01 ENCOUNTER — Encounter: Payer: Self-pay | Admitting: Neurology

## 2013-07-01 ENCOUNTER — Encounter (INDEPENDENT_AMBULATORY_CARE_PROVIDER_SITE_OTHER): Payer: Self-pay

## 2013-07-01 VITALS — BP 135/74 | HR 70 | Ht 65.0 in | Wt 151.0 lb

## 2013-07-01 DIAGNOSIS — R413 Other amnesia: Secondary | ICD-10-CM

## 2013-07-01 DIAGNOSIS — G35 Multiple sclerosis: Secondary | ICD-10-CM | POA: Diagnosis not present

## 2013-07-01 NOTE — Progress Notes (Signed)
PATIENT: Jennifer Atkins DOB: November 05, 1934  HISTORICAL  Jennifer Atkins is a 79 year old right-handed Caucasian female, followup for multiple sclerosis, her primary care physician is [facility] Dr. Shelia Media, last clinical visit was with Hoyle Sauer in May 2014  She had past medical history of relapsing remitting multiple sclerosis, was patient of Dr. Erling Cruz for many years, she had right optic neuritis with symptoms beginning in 1980, with total recovery within one month after po steroid treatment, recurrent right optic neuritis again a year later.  Initially, she has some up and down episodes, but she could recall details.  Over the years, she has developed gradual onset gait difficulty, especially since 2014, also fatigue, mild memory loss,   She has never been on any immunomodulating medications.   She was highly functional, driving, water aerobic, golf regularly, she has urinary urgency, nocturia, taking vesicare, occasionally bladder incontinence when she gets up from a seated position  She fell in Jan 2015, went to ED, had left forehead laceration, previously, she fell in August 2014 at [facility].    She was referred to physical therapy afterwards, machine exercise has made her right leg worse, right foot pain.  She now complains of worsening right foot numbness, right knee pain, has right meniscus repair in the past, right leg numbness, weakness, she also has mild left foot numbness.   UPDATE July 01 2013:  She fell again in June 10th 2015, without warning signs, now with right thumb pain, now she has right foot numbness, multiple falling episodes over past one year. We have reviewed MRI of the brain, mild atrophy, scattered periventricular white matter disease, small vessel disease vs. MS lesions,  MRI thoracic spine,Subtle T2 hyperintensity at T6-7 level, may represent chronic demyelinating plaque. No acute plaques are seen.  MRI lumbar spine (without) demonstrating  multilevel degenerative disc disease, most severe at L5-S1, disc bulging and facet hypertrophy with moderate right and mild left foraminal stenosis; potential impingement upon the right L5 and descending bilateral S1 roots. L3-4: disc bulging and facet hypertrophy with mild right and moderate left foraminal stenosis.  L4-5: disc bulging and facet hypertrophy with moderate right and mild left foraminal stenosis   MRI cervical:C4-5: disc bulging with mild biforaminal stenosis. C5-6. C6-7: disc bulging and uncovertebral joint hypertrophy with mild biforaminal stenosis. No intrinsic or compressive spinal cord lesions. No abnormal enhancing lesions.  ROS: Fatigue, incontinence of bladder, numbness, bilateral knee pain, gait difficulty,  HOME MEDICATIONS: Current Outpatient Prescriptions on File Prior to Visit  Medication Sig Dispense Refill  . Acetaminophen (TYLENOL ARTHRITIS PAIN PO) Take by mouth as needed.      Marland Kitchen amLODipine (NORVASC) 2.5 MG tablet 2.5 mg daily.      Marland Kitchen BABY ASPIRIN PO Take by mouth daily.      . Calcium Carbonate Antacid (TUMS PO) Take by mouth 2 (two) times daily.       Marland Kitchen DIOVAN 320 MG tablet 320 mg daily.      . fish oil-omega-3 fatty acids 1000 MG capsule Take 2 g by mouth 3 (three) times daily.      Marland Kitchen GLUCOSAMINE HCL PO Take by mouth 2 (two) times daily.      . Multiple Vitamins-Minerals (MULTIVITAMIN PO) Take by mouth daily.      . RESTASIS 0.05 % ophthalmic emulsion 0.05 drops 2 (two) times daily.      . VESICARE 5 MG tablet 5 mg daily.      Marland Kitchen VITAMIN D, CHOLECALCIFEROL, PO  Take by mouth 2 (two) times daily.       No current facility-administered medications on file prior to visit.    PAST MEDICAL HISTORY: Past Medical History  Diagnosis Date  . MS (multiple sclerosis)   . Optic neuritis   . Vertigo   . Hypertension   . Memory loss     PAST SURGICAL HISTORY: Past Surgical History  Procedure Laterality Date  . Vaginal hysterectomy    . Colon surgery    . Knee  surgery Right   . Cataracts Bilateral     FAMILY HISTORY: Family History  Problem Relation Age of Onset  . Cancer Father   . Leukemia Mother   . Heart disease    . Heart disease Maternal Grandmother     SOCIAL HISTORY:  History   Social History  . Marital Status: Widowed    Spouse Name: N/A    Number of Children: 6  . Years of Education: College   Occupational History  .      Retired   Social History Main Topics  . Smoking status: Never Smoker   . Smokeless tobacco: Never Used  . Alcohol Use: 0.5 oz/week    1 drink(s) per week     Comment: wine daily  . Drug Use: No  . Sexual Activity: Not on file   Other Topics Concern  . Not on file   Social History Narrative   Patient is retired and has a Gaffer. She is a widow and has 6 children. She lives at the [place] at [facility].      PHYSICAL EXAM   Filed Vitals:   07/01/13 1303  BP: 135/74  Pulse: 70  Height: 5\' 5"  (1.651 m)  Weight: 151 lb (68.493 kg)    Not recorded    Body mass index is 25.13 kg/(m^2).   Generalized: In no acute distress  Neck: Supple, no carotid bruits   Cardiac: Regular rate rhythm  Pulmonary: Clear to auscultation bilaterally  Musculoskeletal: No deformity  Neurological examination  Mentation: Alert oriented to time, place, history taking, and causual conversation  Cranial nerve II-XII: Pupils were equal round reactive to light. Extraocular movements were full.  Visual field were full on confrontational test. Bilateral fundi were sharp.  Facial sensation and strength were normal. Hearing was intact to finger rubbing bilaterally. Uvula tongue midline.  Head turning and shoulder shrug and were normal and symmetric.Tongue protrusion into cheek strength was normal.  Motor: Right thumb swelling tenderness, mild bilateral lower extremity spasticity  Sensory: Intact to fine touch, pinprick, preserved vibratory sensation, and proprioception at toes.  Coordination:  Normal finger to nose, heel-to-shin bilaterally there was no truncal ataxia  Gait: Rising up from seated position without assistance,  Bilateral valgrus knee, cautious, mildly unsteady Romberg signs: Negative  Deep tendon reflexes: Brachioradialis 3/3, biceps 3/3, triceps 3/3, patellar 3/3, Achilles 2/2, nonsustained ankle clonus, plantar responses were extensor bilaterally.   DIAGNOSTIC DATA (LABS, IMAGING, TESTING) - I reviewed patient records, labs, notes, testing and imaging myself where available.    ASSESSMENT AND PLAN  Jennifer Atkins is a 79 y.o. female carries the diagnosis of multiple sclerosis, never received immunomodulation therapy, had progressive worsening gait difficulty,urinary frequency, nocturia, over the past few months, She had hyperreflexia on exam, nonsustained ankle clonus, mild length dependent sensory changes, we have reviewed MRI together, detailed above. There was no active lesions, no enhancing lesions to suggestive of active multiple sclerosis process,no significant canal or foraminal stenosis to  warrant surgery.  She is continue moderate exercise, gait training, return to clinic in one year with Rhae Hammock, M.D. Ph.D.  [facility] [address], [place], [place] [address] [phone]

## 2013-08-24 ENCOUNTER — Other Ambulatory Visit (HOSPITAL_COMMUNITY): Payer: Self-pay | Admitting: Internal Medicine

## 2013-08-24 DIAGNOSIS — Z1231 Encounter for screening mammogram for malignant neoplasm of breast: Secondary | ICD-10-CM

## 2013-08-27 ENCOUNTER — Encounter: Payer: Self-pay | Admitting: Neurology

## 2013-09-21 DIAGNOSIS — M25569 Pain in unspecified knee: Secondary | ICD-10-CM | POA: Diagnosis not present

## 2013-09-29 ENCOUNTER — Ambulatory Visit (HOSPITAL_COMMUNITY)
Admission: RE | Admit: 2013-09-29 | Discharge: 2013-09-29 | Disposition: A | Discharge status: Home / Self Care | Payer: Medicare Other | Source: Ambulatory Visit | Attending: Internal Medicine | Admitting: Internal Medicine

## 2013-09-29 DIAGNOSIS — Z1231 Encounter for screening mammogram for malignant neoplasm of breast: Secondary | ICD-10-CM | POA: Insufficient documentation

## 2013-10-08 DIAGNOSIS — M1710 Unilateral primary osteoarthritis, unspecified knee: Secondary | ICD-10-CM | POA: Diagnosis not present

## 2013-10-08 DIAGNOSIS — IMO0002 Reserved for concepts with insufficient information to code with codable children: Secondary | ICD-10-CM | POA: Diagnosis not present

## 2013-10-25 ENCOUNTER — Other Ambulatory Visit: Payer: Self-pay | Admitting: Physician Assistant

## 2013-10-25 DIAGNOSIS — Z96651 Presence of right artificial knee joint: Secondary | ICD-10-CM | POA: Diagnosis not present

## 2013-10-25 NOTE — H&P (Signed)
TOTAL KNEE ADMISSION H&P  Patient is being admitted for right total knee arthroplasty.  Subjective:  Chief Complaint:right knee pain.   HPI: Jennifer Atkins, 79 y.o. female, has a history of pain and functional disability in the right knee due to arthritis and has failed non-surgical conservative treatments for greater than 12 weeks to includeNSAID's and/or analgesics, corticosteriod injections and activity modification.  Onset of symptoms was gradual, starting >10 years ago with rapidlly worsening course since that time. The patient noted prior procedures on the knee to include  arthroscopy and menisectomy on the right knee(s).  Patient currently rates pain in the right knee(s) at 7 out of 10 with activity. Patient has night pain and worsening of pain with activity and weight bearing.  Patient has evidence of subchondral cysts, subchondral sclerosis and periarticular osteophytes by imaging studies. There is no active infection.  Patient Active Problem List   Diagnosis Date Noted  . MS (multiple sclerosis)   . Memory loss   . Numbness in feet 05/31/2013  . Multiple sclerosis 06/01/2012  . Abnormality of gait 06/01/2012   Past Medical History  Diagnosis Date  . MS (multiple sclerosis)   . Optic neuritis   . Vertigo   . Hypertension   . Memory loss     Past Surgical History  Procedure Laterality Date  . Vaginal hysterectomy    . Colon surgery    . Knee surgery Right   . Cataracts Bilateral      (Not in a hospital admission) No Known Allergies  History  Substance Use Topics  . Smoking status: Never Smoker   . Smokeless tobacco: Never Used  . Alcohol Use: 0.5 oz/week    1 drink(s) per week     Comment: wine daily    Family History  Problem Relation Age of Onset  . Cancer Father   . Leukemia Mother   . Heart disease    . Heart disease Maternal Grandmother      Review of Systems  Constitutional: Negative.   HENT: Negative.   Eyes: Negative.   Respiratory: Negative.    Cardiovascular: Negative.   Gastrointestinal: Negative.   Genitourinary: Positive for urgency and frequency. Negative for dysuria and hematuria.  Musculoskeletal: Positive for back pain and joint pain. Negative for myalgias.  Skin: Negative.   Neurological: Positive for dizziness. Negative for tingling and tremors.  Endo/Heme/Allergies: Bruises/bleeds easily.  Psychiatric/Behavioral: Negative.     Objective:  Physical Exam  Constitutional: She is oriented to person, place, and time. She appears well-developed and well-nourished.  HENT:  Head: Normocephalic and atraumatic.  Eyes: EOM are normal. Pupils are equal, round, and reactive to light.  Neck: Normal range of motion. Neck supple.  Cardiovascular: Normal rate and regular rhythm.  Exam reveals no gallop and no friction rub.   No murmur heard. Respiratory: Effort normal and breath sounds normal. No respiratory distress. She has no wheezes. She has no rales.  GI: Soft. Bowel sounds are normal.  Musculoskeletal:  Antalgic gait, valgus thrust on the right.  Fairly good strength.  No atrophy.  Right knee motion 0-115.  More than 12 degrees of valgus when she stands, but this is correctable.  Tibiofemoral and patellofemoral crepitus.  A little bit of valgus on the left, but nothing nearly as marked.    Neurological: She is alert and oriented to person, place, and time.  Skin: Skin is warm and dry.  Psychiatric: She has a normal mood and affect. Her behavior is normal.  Judgment and thought content normal.    Vital signs in last 24 hours: @VSRANGES @  Labs:   Estimated body mass index is 25.13 kg/(m^2) as calculated from the following:   Height as of 07/01/13: 5\' 5"  (1.651 m).   Weight as of 07/01/13: 68.493 kg (151 lb).   Imaging Review Plain radiographs demonstrate severe degenerative joint disease of the right knee(s). The overall alignment ismild valgus. The bone quality appears to be fair for age and reported activity  level.  Assessment/Plan:  End stage arthritis, right knee   The patient history, physical examination, clinical judgment of the provider and imaging studies are consistent with end stage degenerative joint disease of the right knee(s) and total knee arthroplasty is deemed medically necessary. The treatment options including medical management, injection therapy arthroscopy and arthroplasty were discussed at length. The risks and benefits of total knee arthroplasty were presented and reviewed. The risks due to aseptic loosening, infection, stiffness, patella tracking problems, thromboembolic complications and other imponderables were discussed. The patient acknowledged the explanation, agreed to proceed with the plan and consent was signed. Patient is being admitted for inpatient treatment for surgery, pain control, PT, OT, prophylactic antibiotics, VTE prophylaxis, progressive ambulation and ADL's and discharge planning. The patient is planning to be discharged to skilled nursing facility

## 2013-10-27 ENCOUNTER — Encounter (HOSPITAL_COMMUNITY): Payer: Self-pay

## 2013-11-02 ENCOUNTER — Encounter (HOSPITAL_COMMUNITY)
Admission: RE | Admit: 2013-11-02 | Discharge: 2013-11-02 | Disposition: A | Discharge status: Home / Self Care | Payer: Medicare Other | Source: Ambulatory Visit | Attending: Orthopedic Surgery | Admitting: Orthopedic Surgery

## 2013-11-02 ENCOUNTER — Encounter (HOSPITAL_COMMUNITY): Payer: Self-pay

## 2013-11-02 ENCOUNTER — Encounter (HOSPITAL_COMMUNITY)
Admission: RE | Admit: 2013-11-02 | Discharge: 2013-11-02 | Disposition: A | Discharge status: Home / Self Care | Payer: Medicare Other | Source: Ambulatory Visit | Attending: Physician Assistant | Admitting: Physician Assistant

## 2013-11-02 DIAGNOSIS — M1711 Unilateral primary osteoarthritis, right knee: Secondary | ICD-10-CM | POA: Diagnosis not present

## 2013-11-02 DIAGNOSIS — G35 Multiple sclerosis: Secondary | ICD-10-CM | POA: Diagnosis not present

## 2013-11-02 DIAGNOSIS — Z0000 Encounter for general adult medical examination without abnormal findings: Secondary | ICD-10-CM | POA: Diagnosis not present

## 2013-11-02 DIAGNOSIS — Z01818 Encounter for other preprocedural examination: Secondary | ICD-10-CM | POA: Diagnosis not present

## 2013-11-02 DIAGNOSIS — R42 Dizziness and giddiness: Secondary | ICD-10-CM | POA: Diagnosis not present

## 2013-11-02 DIAGNOSIS — R413 Other amnesia: Secondary | ICD-10-CM | POA: Insufficient documentation

## 2013-11-02 DIAGNOSIS — I10 Essential (primary) hypertension: Secondary | ICD-10-CM | POA: Diagnosis not present

## 2013-11-02 DIAGNOSIS — H469 Unspecified optic neuritis: Secondary | ICD-10-CM | POA: Insufficient documentation

## 2013-11-02 HISTORY — DX: Unspecified osteoarthritis, unspecified site: M19.90

## 2013-11-02 LAB — COMPREHENSIVE METABOLIC PANEL
ALT: 19 U/L (ref 0–35)
AST: 28 U/L (ref 0–37)
Albumin: 4.1 g/dL (ref 3.5–5.2)
Alkaline Phosphatase: 73 U/L (ref 39–117)
Anion gap: 15 (ref 5–15)
BILIRUBIN TOTAL: 1 mg/dL (ref 0.3–1.2)
BUN: 23 mg/dL (ref 6–23)
CO2: 20 meq/L (ref 19–32)
Calcium: 9.8 mg/dL (ref 8.4–10.5)
Chloride: 103 mEq/L (ref 96–112)
Creatinine, Ser: 0.61 mg/dL (ref 0.50–1.10)
GFR calc Af Amer: >90 mL/min (ref >90)
GFR, EST NON AFRICAN AMERICAN: 86 mL/min — AB (ref >90)
GLUCOSE: 101 mg/dL — AB (ref 70–99)
Potassium: 4.5 mEq/L (ref 3.7–5.3)
SODIUM: 138 meq/L (ref 137–147)
Total Protein: 7.3 g/dL (ref 6.0–8.3)

## 2013-11-02 LAB — CBC WITH DIFFERENTIAL/PLATELET
BASOS ABS: 0 10*3/uL (ref 0.0–0.1)
Basophils Relative: 1 % (ref 0–1)
Eosinophils Absolute: 0.1 10*3/uL (ref 0.0–0.7)
Eosinophils Relative: 1 % (ref 0–5)
HCT: 43 % (ref 36.0–46.0)
Hemoglobin: 14.6 g/dL (ref 12.0–15.0)
LYMPHS ABS: 1.8 10*3/uL (ref 0.7–4.0)
LYMPHS PCT: 29 % (ref 12–46)
MCH: 33 pg (ref 26.0–34.0)
MCHC: 34 g/dL (ref 30.0–36.0)
MCV: 97.1 fL (ref 78.0–100.0)
Monocytes Absolute: 0.9 10*3/uL (ref 0.1–1.0)
Monocytes Relative: 15 % — ABNORMAL HIGH (ref 3–12)
Neutro Abs: 3.4 10*3/uL (ref 1.7–7.7)
Neutrophils Relative %: 54 % (ref 43–77)
PLATELETS: 278 10*3/uL (ref 150–400)
RBC: 4.43 MIL/uL (ref 3.87–5.11)
RDW: 12.7 % (ref 11.5–15.5)
WBC: 6.3 10*3/uL (ref 4.0–10.5)

## 2013-11-02 LAB — URINALYSIS, ROUTINE W REFLEX MICROSCOPIC
Bilirubin Urine: NEGATIVE
Glucose, UA: NEGATIVE mg/dL
Hgb urine dipstick: NEGATIVE
KETONES UR: NEGATIVE mg/dL
LEUKOCYTES UA: NEGATIVE
NITRITE: NEGATIVE
PH: 6 (ref 5.0–8.0)
Protein, ur: NEGATIVE mg/dL
SPECIFIC GRAVITY, URINE: 1.012 (ref 1.005–1.030)
Urobilinogen, UA: 0.2 mg/dL (ref 0.0–1.0)

## 2013-11-02 LAB — SURGICAL PCR SCREEN
MRSA, PCR: NEGATIVE
Staphylococcus aureus: NEGATIVE

## 2013-11-02 LAB — TYPE AND SCREEN
ABO/RH(D): O POS
Antibody Screen: NEGATIVE

## 2013-11-02 LAB — APTT: APTT: 29 s (ref 24–37)

## 2013-11-02 LAB — ABO/RH: ABO/RH(D): O POS

## 2013-11-02 LAB — PROTIME-INR
INR: 0.97 (ref 0.00–1.49)
Prothrombin Time: 12.9 seconds (ref 11.6–15.2)

## 2013-11-02 NOTE — Pre-Procedure Instructions (Signed)
Jennifer Atkins  11/02/2013   Your procedure is scheduled on:  11/10/13  Report to [facility] at 10 AM.  Call this number if you have problems the morning of surgery: [phone]   Remember:   Do not eat food or drink liquids after midnight.   Take these medicines the morning of surgery with A SIP OF WATER: norvasc,tylenol,eye drops   Do not wear jewelry, make-up or nail polish.  Do not wear lotions, powders, or perfumes. You may wear deodorant.  Do not shave 48 hours prior to surgery. Men may shave face and neck.  Do not bring valuables to the hospital.  [facility] is not responsible                  for any belongings or valuables.               Contacts, dentures or bridgework may not be worn into surgery.  Leave suitcase in the car. After surgery it may be brought to your room.  For patients admitted to the hospital, discharge time is determined by your                treatment team.               Patients discharged the day of surgery will not be allowed to drive  home.  Name and phone number of your driver: family  Special Instructions: Incentive Spirometry - Practice and bring it with you on the day of surgery.   Please read over the following fact sheets that you were given: Pain Booklet, Coughing and Deep Breathing, Blood Transfusion Information, MRSA Information and Surgical Site Infection Prevention

## 2013-11-03 NOTE — Progress Notes (Signed)
Pt called this am stating that she had forgotten to give [place] 2 medications that she takes every day. She stated that she uses TUMS 2 tabs BID and Refresh Optic drops 1 drop each eye 15 minutes after using her Restasis eye drops BID. When I went to add the medications to her med list, the TUMS were already in there, I just changed to the dosage to 2 tab BID. I added the Refresh Optic drops to her list.

## 2013-11-09 MED ORDER — CEFAZOLIN SODIUM-DEXTROSE 2-3 GM-% IV SOLR
2.0000 g | INTRAVENOUS | Status: AC
  Administered 2013-11-10: 2 g via INTRAVENOUS
  Filled 2013-11-09: qty 50

## 2013-11-09 MED ORDER — LACTATED RINGERS IV SOLN
INTRAVENOUS | Status: DC
  Administered 2013-11-10: 09:00:00 via INTRAVENOUS

## 2013-11-09 MED ORDER — CHLORHEXIDINE GLUCONATE 4 % EX LIQD
60.0000 mL | Freq: Once | CUTANEOUS | Status: DC
  Filled 2013-11-09: qty 60

## 2013-11-09 NOTE — Progress Notes (Signed)
Patient notified to arrive at 08:00.

## 2013-11-10 ENCOUNTER — Encounter (HOSPITAL_COMMUNITY)
Admission: RE | Disposition: A | Discharge status: Skilled Nursing Facility | Payer: Self-pay | Source: Ambulatory Visit | Attending: Orthopedic Surgery

## 2013-11-10 ENCOUNTER — Inpatient Hospital Stay (HOSPITAL_COMMUNITY): Payer: Medicare Other | Admitting: Certified Registered Nurse Anesthetist

## 2013-11-10 ENCOUNTER — Inpatient Hospital Stay (HOSPITAL_COMMUNITY): Payer: Medicare Other

## 2013-11-10 ENCOUNTER — Encounter (HOSPITAL_COMMUNITY): Payer: Medicare Other | Admitting: Certified Registered Nurse Anesthetist

## 2013-11-10 ENCOUNTER — Inpatient Hospital Stay (HOSPITAL_COMMUNITY)
Admission: RE | Admit: 2013-11-10 | Discharge: 2013-11-12 | DRG: 470 | Disposition: A | Discharge status: Skilled Nursing Facility | Payer: Medicare Other | Source: Ambulatory Visit | Attending: Orthopedic Surgery | Admitting: Orthopedic Surgery

## 2013-11-10 ENCOUNTER — Encounter (HOSPITAL_COMMUNITY): Payer: Self-pay | Admitting: *Deleted

## 2013-11-10 DIAGNOSIS — R531 Weakness: Secondary | ICD-10-CM | POA: Diagnosis not present

## 2013-11-10 DIAGNOSIS — Z96659 Presence of unspecified artificial knee joint: Secondary | ICD-10-CM | POA: Diagnosis not present

## 2013-11-10 DIAGNOSIS — M25561 Pain in right knee: Secondary | ICD-10-CM | POA: Diagnosis not present

## 2013-11-10 DIAGNOSIS — Z471 Aftercare following joint replacement surgery: Secondary | ICD-10-CM | POA: Diagnosis not present

## 2013-11-10 DIAGNOSIS — M6281 Muscle weakness (generalized): Secondary | ICD-10-CM | POA: Diagnosis not present

## 2013-11-10 DIAGNOSIS — M179 Osteoarthritis of knee, unspecified: Secondary | ICD-10-CM | POA: Diagnosis not present

## 2013-11-10 DIAGNOSIS — D62 Acute posthemorrhagic anemia: Secondary | ICD-10-CM | POA: Diagnosis not present

## 2013-11-10 DIAGNOSIS — Z8249 Family history of ischemic heart disease and other diseases of the circulatory system: Secondary | ICD-10-CM

## 2013-11-10 DIAGNOSIS — R110 Nausea: Secondary | ICD-10-CM | POA: Diagnosis not present

## 2013-11-10 DIAGNOSIS — Z7982 Long term (current) use of aspirin: Secondary | ICD-10-CM

## 2013-11-10 DIAGNOSIS — Z806 Family history of leukemia: Secondary | ICD-10-CM | POA: Diagnosis not present

## 2013-11-10 DIAGNOSIS — M1711 Unilateral primary osteoarthritis, right knee: Principal | ICD-10-CM | POA: Diagnosis present

## 2013-11-10 DIAGNOSIS — I10 Essential (primary) hypertension: Secondary | ICD-10-CM | POA: Diagnosis present

## 2013-11-10 DIAGNOSIS — G8918 Other acute postprocedural pain: Secondary | ICD-10-CM | POA: Diagnosis not present

## 2013-11-10 DIAGNOSIS — R262 Difficulty in walking, not elsewhere classified: Secondary | ICD-10-CM | POA: Diagnosis not present

## 2013-11-10 DIAGNOSIS — G35 Multiple sclerosis: Secondary | ICD-10-CM | POA: Diagnosis present

## 2013-11-10 DIAGNOSIS — R350 Frequency of micturition: Secondary | ICD-10-CM | POA: Diagnosis not present

## 2013-11-10 DIAGNOSIS — Z96651 Presence of right artificial knee joint: Secondary | ICD-10-CM | POA: Diagnosis not present

## 2013-11-10 DIAGNOSIS — K5900 Constipation, unspecified: Secondary | ICD-10-CM | POA: Diagnosis not present

## 2013-11-10 DIAGNOSIS — M1710 Unilateral primary osteoarthritis, unspecified knee: Secondary | ICD-10-CM | POA: Diagnosis present

## 2013-11-10 DIAGNOSIS — Z79899 Other long term (current) drug therapy: Secondary | ICD-10-CM | POA: Diagnosis not present

## 2013-11-10 DIAGNOSIS — M1990 Unspecified osteoarthritis, unspecified site: Secondary | ICD-10-CM | POA: Diagnosis not present

## 2013-11-10 DIAGNOSIS — H469 Unspecified optic neuritis: Secondary | ICD-10-CM | POA: Diagnosis not present

## 2013-11-10 HISTORY — DX: Urgency of urination: R39.15

## 2013-11-10 HISTORY — PX: TOTAL KNEE ARTHROPLASTY: SHX125

## 2013-11-10 SURGERY — ARTHROPLASTY, KNEE, TOTAL
Anesthesia: General | Site: Knee | Laterality: Right

## 2013-11-10 MED ORDER — BISACODYL 5 MG PO TBEC: 5.0000 mg | DELAYED_RELEASE_TABLET | Freq: Every day | ORAL | Status: DC | PRN

## 2013-11-10 MED ORDER — HYDROMORPHONE HCL 1 MG/ML IJ SOLN
0.2500 mg | INTRAMUSCULAR | Status: DC | PRN
  Administered 2013-11-10 (×5): 0.25 mg via INTRAVENOUS

## 2013-11-10 MED ORDER — ASPIRIN EC 325 MG PO TBEC: 325.0000 mg | DELAYED_RELEASE_TABLET | Freq: Every day | ORAL | Status: DC

## 2013-11-10 MED ORDER — OXYCODONE HCL 5 MG PO TABS: 5.0000 mg | ORAL_TABLET | Freq: Once | ORAL | Status: DC | PRN

## 2013-11-10 MED ORDER — AMLODIPINE BESYLATE 2.5 MG PO TABS
2.5000 mg | ORAL_TABLET | Freq: Every day | ORAL | Status: DC
  Administered 2013-11-11 – 2013-11-12 (×2): 2.5 mg via ORAL
  Filled 2013-11-10 (×2): qty 1

## 2013-11-10 MED ORDER — PROPOFOL 10 MG/ML IV BOLUS
INTRAVENOUS | Status: AC
  Filled 2013-11-10: qty 20

## 2013-11-10 MED ORDER — POLYVINYL ALCOHOL-POVIDONE 1.4-0.6 % OP SOLN: 1.0000 [drp] | Freq: Two times a day (BID) | OPHTHALMIC | Status: DC

## 2013-11-10 MED ORDER — ALUM & MAG HYDROXIDE-SIMETH 200-200-20 MG/5ML PO SUSP: 30.0000 mL | ORAL | Status: DC | PRN

## 2013-11-10 MED ORDER — LACTATED RINGERS IV SOLN
INTRAVENOUS | Status: DC | PRN
  Administered 2013-11-10 (×2): via INTRAVENOUS

## 2013-11-10 MED ORDER — OXYCODONE HCL 5 MG PO TABS
5.0000 mg | ORAL_TABLET | ORAL | Status: DC | PRN
  Administered 2013-11-10 – 2013-11-11 (×2): 10 mg via ORAL
  Administered 2013-11-11: 5 mg via ORAL
  Administered 2013-11-11 (×2): 10 mg via ORAL
  Administered 2013-11-11 – 2013-11-12 (×2): 5 mg via ORAL
  Filled 2013-11-10: qty 2
  Filled 2013-11-10: qty 1
  Filled 2013-11-10 (×2): qty 2
  Filled 2013-11-10 (×2): qty 1
  Filled 2013-11-10: qty 2

## 2013-11-10 MED ORDER — MENTHOL 3 MG MT LOZG: 1.0000 | LOZENGE | OROMUCOSAL | Status: DC | PRN

## 2013-11-10 MED ORDER — DIPHENHYDRAMINE HCL 12.5 MG/5ML PO ELIX: 12.5000 mg | ORAL_SOLUTION | ORAL | Status: DC | PRN

## 2013-11-10 MED ORDER — BUPIVACAINE LIPOSOME 1.3 % IJ SUSP
INTRAMUSCULAR | Status: DC | PRN
  Administered 2013-11-10: 20 mL

## 2013-11-10 MED ORDER — SODIUM CHLORIDE 0.9 % IJ SOLN
INTRAMUSCULAR | Status: DC | PRN
  Administered 2013-11-10: 40 mL

## 2013-11-10 MED ORDER — EPHEDRINE SULFATE 50 MG/ML IJ SOLN
INTRAMUSCULAR | Status: AC
  Filled 2013-11-10: qty 1

## 2013-11-10 MED ORDER — CYCLOSPORINE 0.05 % OP EMUL
1.0000 [drp] | Freq: Two times a day (BID) | OPHTHALMIC | Status: DC
  Administered 2013-11-10 – 2013-11-12 (×4): 1 [drp] via OPHTHALMIC
  Filled 2013-11-10 (×6): qty 1

## 2013-11-10 MED ORDER — FENTANYL CITRATE 0.05 MG/ML IJ SOLN
INTRAMUSCULAR | Status: AC
  Filled 2013-11-10: qty 5

## 2013-11-10 MED ORDER — ONDANSETRON HCL 4 MG/2ML IJ SOLN
INTRAMUSCULAR | Status: AC
  Filled 2013-11-10: qty 2

## 2013-11-10 MED ORDER — MIDAZOLAM HCL 2 MG/2ML IJ SOLN
INTRAMUSCULAR | Status: AC
  Filled 2013-11-10: qty 2

## 2013-11-10 MED ORDER — BUPIVACAINE HCL (PF) 0.5 % IJ SOLN
INTRAMUSCULAR | Status: AC
  Filled 2013-11-10: qty 10

## 2013-11-10 MED ORDER — HYDROMORPHONE HCL 1 MG/ML IJ SOLN
INTRAMUSCULAR | Status: AC
  Filled 2013-11-10: qty 1

## 2013-11-10 MED ORDER — ACETAMINOPHEN 325 MG PO TABS: 650.0000 mg | ORAL_TABLET | Freq: Four times a day (QID) | ORAL | Status: DC | PRN

## 2013-11-10 MED ORDER — ARTIFICIAL TEARS OP OINT
TOPICAL_OINTMENT | OPHTHALMIC | Status: AC
  Filled 2013-11-10: qty 3.5

## 2013-11-10 MED ORDER — ROCURONIUM BROMIDE 50 MG/5ML IV SOLN
INTRAVENOUS | Status: AC
  Filled 2013-11-10: qty 1

## 2013-11-10 MED ORDER — LIDOCAINE HCL (CARDIAC) 20 MG/ML IV SOLN
INTRAVENOUS | Status: DC | PRN
  Administered 2013-11-10: 40 mg via INTRAVENOUS

## 2013-11-10 MED ORDER — IRBESARTAN 300 MG PO TABS
300.0000 mg | ORAL_TABLET | Freq: Every day | ORAL | Status: DC
  Administered 2013-11-11 – 2013-11-12 (×2): 300 mg via ORAL
  Filled 2013-11-10 (×3): qty 1

## 2013-11-10 MED ORDER — METHOCARBAMOL 500 MG PO TABS
500.0000 mg | ORAL_TABLET | Freq: Four times a day (QID) | ORAL | Status: DC | PRN
  Administered 2013-11-11: 500 mg via ORAL
  Filled 2013-11-10 (×2): qty 1

## 2013-11-10 MED ORDER — CEFAZOLIN SODIUM 1-5 GM-% IV SOLN
1.0000 g | Freq: Four times a day (QID) | INTRAVENOUS | Status: AC
  Administered 2013-11-10 (×2): 1 g via INTRAVENOUS
  Filled 2013-11-10 (×2): qty 50

## 2013-11-10 MED ORDER — DOCUSATE SODIUM 100 MG PO CAPS
100.0000 mg | ORAL_CAPSULE | Freq: Two times a day (BID) | ORAL | Status: DC
  Administered 2013-11-10 – 2013-11-11 (×3): 100 mg via ORAL
  Filled 2013-11-10 (×4): qty 1

## 2013-11-10 MED ORDER — EPHEDRINE SULFATE 50 MG/ML IJ SOLN
INTRAMUSCULAR | Status: DC | PRN
  Administered 2013-11-10 (×3): 5 mg via INTRAVENOUS

## 2013-11-10 MED ORDER — HYDROMORPHONE HCL 1 MG/ML IJ SOLN: 0.5000 mg | INTRAMUSCULAR | Status: DC | PRN

## 2013-11-10 MED ORDER — POLYVINYL ALCOHOL 1.4 % OP SOLN
1.0000 [drp] | Freq: Two times a day (BID) | OPHTHALMIC | Status: DC
  Administered 2013-11-10 – 2013-11-12 (×5): 1 [drp] via OPHTHALMIC
  Filled 2013-11-10: qty 15

## 2013-11-10 MED ORDER — METHOCARBAMOL 1000 MG/10ML IJ SOLN
500.0000 mg | INTRAVENOUS | Status: DC
  Filled 2013-11-10: qty 5

## 2013-11-10 MED ORDER — HYPROMELLOSE (GONIOSCOPIC) 2.5 % OP SOLN
1.0000 [drp] | Freq: Two times a day (BID) | OPHTHALMIC | Status: DC
Start: 2013-11-10 — End: 2013-11-10
  Filled 2013-11-10: qty 15

## 2013-11-10 MED ORDER — FENTANYL CITRATE 0.05 MG/ML IJ SOLN
INTRAMUSCULAR | Status: DC | PRN
  Administered 2013-11-10 (×3): 25 ug via INTRAVENOUS
  Administered 2013-11-10: 50 ug via INTRAVENOUS
  Administered 2013-11-10 (×3): 25 ug via INTRAVENOUS

## 2013-11-10 MED ORDER — ONDANSETRON HCL 4 MG/2ML IJ SOLN
INTRAMUSCULAR | Status: DC | PRN
Start: 2013-11-10 — End: 2013-11-10
  Administered 2013-11-10: 4 mg via INTRAVENOUS

## 2013-11-10 MED ORDER — METHOCARBAMOL 1000 MG/10ML IJ SOLN
500.0000 mg | Freq: Four times a day (QID) | INTRAVENOUS | Status: DC | PRN
  Administered 2013-11-10: 500 mg via INTRAVENOUS
  Filled 2013-11-10: qty 5

## 2013-11-10 MED ORDER — ACETAMINOPHEN 650 MG RE SUPP: 650.0000 mg | Freq: Four times a day (QID) | RECTAL | Status: DC | PRN

## 2013-11-10 MED ORDER — STERILE WATER FOR INJECTION IJ SOLN
INTRAMUSCULAR | Status: AC
  Filled 2013-11-10: qty 10

## 2013-11-10 MED ORDER — HYDROCODONE-ACETAMINOPHEN 7.5-325 MG PO TABS: 1.0000 | ORAL_TABLET | ORAL | Status: DC | PRN

## 2013-11-10 MED ORDER — SODIUM CHLORIDE 0.9 % IR SOLN
Status: DC | PRN
  Administered 2013-11-10: 1000 mL
  Administered 2013-11-10: 3000 mL

## 2013-11-10 MED ORDER — DEXAMETHASONE SODIUM PHOSPHATE 4 MG/ML IJ SOLN
INTRAMUSCULAR | Status: DC | PRN
  Administered 2013-11-10: 4 mg via INTRAVENOUS

## 2013-11-10 MED ORDER — PROPOFOL 10 MG/ML IV BOLUS
INTRAVENOUS | Status: DC | PRN
  Administered 2013-11-10: 150 mg via INTRAVENOUS

## 2013-11-10 MED ORDER — ONDANSETRON HCL 4 MG/2ML IJ SOLN: 4.0000 mg | Freq: Four times a day (QID) | INTRAMUSCULAR | Status: DC | PRN

## 2013-11-10 MED ORDER — METOCLOPRAMIDE HCL 5 MG/ML IJ SOLN: 5.0000 mg | Freq: Three times a day (TID) | INTRAMUSCULAR | Status: DC | PRN

## 2013-11-10 MED ORDER — BUPIVACAINE HCL 0.5 % IJ SOLN
INTRAMUSCULAR | Status: DC | PRN
  Administered 2013-11-10: 10 mL

## 2013-11-10 MED ORDER — ONDANSETRON HCL 4 MG PO TABS: 4.0000 mg | ORAL_TABLET | Freq: Three times a day (TID) | ORAL | Status: DC | PRN

## 2013-11-10 MED ORDER — DARIFENACIN HYDROBROMIDE ER 7.5 MG PO TB24
7.5000 mg | ORAL_TABLET | Freq: Every day | ORAL | Status: DC
  Administered 2013-11-11 – 2013-11-12 (×2): 7.5 mg via ORAL
  Filled 2013-11-10 (×2): qty 1

## 2013-11-10 MED ORDER — POTASSIUM CHLORIDE IN NACL 20-0.9 MEQ/L-% IV SOLN
INTRAVENOUS | Status: DC
  Administered 2013-11-10: 21:00:00 via INTRAVENOUS
  Filled 2013-11-10 (×3): qty 1000

## 2013-11-10 MED ORDER — METOCLOPRAMIDE HCL 10 MG PO TABS: 5.0000 mg | ORAL_TABLET | Freq: Three times a day (TID) | ORAL | Status: DC | PRN

## 2013-11-10 MED ORDER — CELECOXIB 200 MG PO CAPS
200.0000 mg | ORAL_CAPSULE | Freq: Two times a day (BID) | ORAL | Status: DC
  Administered 2013-11-10 – 2013-11-12 (×4): 200 mg via ORAL
  Filled 2013-11-10 (×5): qty 1

## 2013-11-10 MED ORDER — PHENOL 1.4 % MT LIQD: 1.0000 | OROMUCOSAL | Status: DC | PRN

## 2013-11-10 MED ORDER — PROPOFOL INFUSION 10 MG/ML OPTIME
INTRAVENOUS | Status: DC | PRN
  Administered 2013-11-10: 25 ug/kg/min via INTRAVENOUS

## 2013-11-10 MED ORDER — OXYCODONE HCL 5 MG/5ML PO SOLN: 5.0000 mg | Freq: Once | ORAL | Status: DC | PRN

## 2013-11-10 MED ORDER — ASPIRIN EC 325 MG PO TBEC
325.0000 mg | DELAYED_RELEASE_TABLET | Freq: Every day | ORAL | Status: DC
  Administered 2013-11-11 – 2013-11-12 (×2): 325 mg via ORAL
  Filled 2013-11-10 (×3): qty 1

## 2013-11-10 MED ORDER — MIDAZOLAM HCL 5 MG/5ML IJ SOLN
INTRAMUSCULAR | Status: DC | PRN
  Administered 2013-11-10: 2 mg via INTRAVENOUS

## 2013-11-10 MED ORDER — BUPIVACAINE LIPOSOME 1.3 % IJ SUSP
20.0000 mL | INTRAMUSCULAR | Status: DC
  Filled 2013-11-10: qty 20

## 2013-11-10 MED ORDER — SUCCINYLCHOLINE CHLORIDE 20 MG/ML IJ SOLN
INTRAMUSCULAR | Status: AC
  Filled 2013-11-10: qty 1

## 2013-11-10 MED ORDER — ONDANSETRON HCL 4 MG PO TABS: 4.0000 mg | ORAL_TABLET | Freq: Four times a day (QID) | ORAL | Status: DC | PRN

## 2013-11-10 SURGICAL SUPPLY — 67 items
APL SKNCLS STERI-STRIP NONHPOA (GAUZE/BANDAGES/DRESSINGS) ×1
BANDAGE ELASTIC 4 VELCRO ST LF (GAUZE/BANDAGES/DRESSINGS) ×3 IMPLANT
BANDAGE ELASTIC 6 VELCRO ST LF (GAUZE/BANDAGES/DRESSINGS) ×3 IMPLANT
BANDAGE ESMARK 6X9 LF (GAUZE/BANDAGES/DRESSINGS) ×1 IMPLANT
BENZOIN TINCTURE PRP APPL 2/3 (GAUZE/BANDAGES/DRESSINGS) ×3 IMPLANT
BLADE SAG 18X100X1.27 (BLADE) ×6 IMPLANT
BNDG CMPR 9X6 STRL LF SNTH (GAUZE/BANDAGES/DRESSINGS) ×1
BNDG ESMARK 6X9 LF (GAUZE/BANDAGES/DRESSINGS) ×3
BOWL SMART MIX CTS (DISPOSABLE) ×3 IMPLANT
CEMENT BONE SIMPLEX SPEEDSET (Cement) ×6 IMPLANT
CLOSURE STERI-STRIP 1/2X4 (GAUZE/BANDAGES/DRESSINGS) ×1
CLOSURE WOUND 1/2 X4 (GAUZE/BANDAGES/DRESSINGS) ×2
CLSR STERI-STRIP ANTIMIC 1/2X4 (GAUZE/BANDAGES/DRESSINGS) ×2 IMPLANT
COVER SURGICAL LIGHT HANDLE (MISCELLANEOUS) ×3 IMPLANT
CUFF TOURNIQUET SINGLE 34IN LL (TOURNIQUET CUFF) ×3 IMPLANT
DRAPE EXTREMITY T 121X128X90 (DRAPE) ×3 IMPLANT
DRAPE PROXIMA HALF (DRAPES) ×3 IMPLANT
DRAPE U-SHAPE 47X51 STRL (DRAPES) ×3 IMPLANT
DRSG ADAPTIC 3X8 NADH LF (GAUZE/BANDAGES/DRESSINGS) ×3 IMPLANT
DRSG PAD ABDOMINAL 8X10 ST (GAUZE/BANDAGES/DRESSINGS) ×3 IMPLANT
DURAPREP 26ML APPLICATOR (WOUND CARE) ×6 IMPLANT
ELECT CAUTERY BLADE 6.4 (BLADE) ×3 IMPLANT
ELECT REM PT RETURN 9FT ADLT (ELECTROSURGICAL) ×3
ELECTRODE REM PT RTRN 9FT ADLT (ELECTROSURGICAL) ×1 IMPLANT
EVACUATOR 1/8 PVC DRAIN (DRAIN) ×3 IMPLANT
FACESHIELD WRAPAROUND (MASK) ×6 IMPLANT
GAUZE SPONGE 4X4 12PLY STRL (GAUZE/BANDAGES/DRESSINGS) ×3 IMPLANT
GLOVE BIOGEL PI IND STRL 7.0 (GLOVE) ×2 IMPLANT
GLOVE BIOGEL PI INDICATOR 7.0 (GLOVE) ×4
GLOVE ECLIPSE 6.5 STRL STRAW (GLOVE) ×6 IMPLANT
GLOVE ORTHO TXT STRL SZ7.5 (GLOVE) ×3 IMPLANT
GOWN STRL REUS W/ TWL LRG LVL3 (GOWN DISPOSABLE) ×1 IMPLANT
GOWN STRL REUS W/ TWL XL LVL3 (GOWN DISPOSABLE) ×1 IMPLANT
GOWN STRL REUS W/TWL LRG LVL3 (GOWN DISPOSABLE) ×3
GOWN STRL REUS W/TWL XL LVL3 (GOWN DISPOSABLE) ×3
HANDPIECE INTERPULSE COAX TIP (DISPOSABLE) ×3
IMMOBILIZER KNEE 22 UNIV (SOFTGOODS) ×3 IMPLANT
IMMOBILIZER KNEE 24 THIGH 36 (MISCELLANEOUS) IMPLANT
IMMOBILIZER KNEE 24 UNIV (MISCELLANEOUS)
KIT BASIN OR (CUSTOM PROCEDURE TRAY) ×3 IMPLANT
KIT ROOM TURNOVER OR (KITS) ×3 IMPLANT
KNEE/VIT E POLY LINER LEVEL 1B ×3 IMPLANT
MANIFOLD NEPTUNE II (INSTRUMENTS) ×3 IMPLANT
NEEDLE 18GX1X1/2 (RX/OR ONLY) (NEEDLE) ×3 IMPLANT
NEEDLE 25GX 5/8IN NON SAFETY (NEEDLE) ×3 IMPLANT
NS IRRIG 1000ML POUR BTL (IV SOLUTION) ×3 IMPLANT
PACK TOTAL JOINT (CUSTOM PROCEDURE TRAY) ×3 IMPLANT
PAD ARMBOARD 7.5X6 YLW CONV (MISCELLANEOUS) ×6 IMPLANT
PAD CAST 4YDX4 CTTN HI CHSV (CAST SUPPLIES) ×1 IMPLANT
PADDING CAST COTTON 4X4 STRL (CAST SUPPLIES) ×3
PADDING CAST COTTON 6X4 STRL (CAST SUPPLIES) ×3 IMPLANT
SET HNDPC FAN SPRY TIP SCT (DISPOSABLE) ×1 IMPLANT
SPONGE GAUZE 4X4 12PLY STER LF (GAUZE/BANDAGES/DRESSINGS) ×3 IMPLANT
STRIP CLOSURE SKIN 1/2X4 (GAUZE/BANDAGES/DRESSINGS) ×4 IMPLANT
SUCTION FRAZIER TIP 10 FR DISP (SUCTIONS) ×3 IMPLANT
SUT MNCRL AB 4-0 PS2 18 (SUTURE) ×3 IMPLANT
SUT VIC AB 0 CT1 27 (SUTURE)
SUT VIC AB 0 CT1 27XBRD ANBCTR (SUTURE) IMPLANT
SUT VIC AB 1 CT1 27 (SUTURE) ×6
SUT VIC AB 1 CT1 27XBRD ANBCTR (SUTURE) ×2 IMPLANT
SUT VIC AB 2-0 CT1 27 (SUTURE) ×6
SUT VIC AB 2-0 CT1 TAPERPNT 27 (SUTURE) ×2 IMPLANT
SYR 50ML LL SCALE MARK (SYRINGE) ×3 IMPLANT
SYR CONTROL 10ML LL (SYRINGE) ×3 IMPLANT
TOWEL OR 17X24 6PK STRL BLUE (TOWEL DISPOSABLE) ×3 IMPLANT
TOWEL OR 17X26 10 PK STRL BLUE (TOWEL DISPOSABLE) ×3 IMPLANT
WATER STERILE IRR 1000ML POUR (IV SOLUTION) ×6 IMPLANT

## 2013-11-10 NOTE — Anesthesia Procedure Notes (Signed)
Procedure Name: LMA Insertion Date/Time: 11/10/2013 10:17 AM Performed by: Maryland Pink Pre-anesthesia Checklist: Patient identified, Emergency Drugs available, Suction available, Patient being monitored and Timeout performed Patient Re-evaluated:Patient Re-evaluated prior to inductionOxygen Delivery Method: Circle system utilized Preoxygenation: Pre-oxygenation with 100% oxygen Intubation Type: IV induction LMA: LMA inserted LMA Size: 4.0 Number of attempts: 1 Placement Confirmation: positive ETCO2 and breath sounds checked- equal and bilateral Tube secured with: Tape Dental Injury: Teeth and Oropharynx as per pre-operative assessment

## 2013-11-10 NOTE — Progress Notes (Signed)
Orthopedic Tech Progress Note Patient Details:  Jennifer Atkins 04-25-1934 [id number] Applied CPM to RLE.  Left Bone Foam with pt.'s nurse. CPM Right Knee CPM Right Knee: On Right Knee Flexion (Degrees): 90 Right Knee Extension (Degrees): 0   Darrol Poke 11/10/2013, 12:59 PM

## 2013-11-10 NOTE — Discharge Summary (Addendum)
Patient ID: Jennifer Atkins MRN: [id number] DOB/AGE: 02-06-1934 79 y.o.  Admit date: 11/10/2013 Discharge date: 11/12/2013  Admission Diagnoses:  Active Problems:   DJD (degenerative joint disease) of knee   Discharge Diagnoses:  Same  Past Medical History  Diagnosis Date  . MS (multiple sclerosis)   . Optic neuritis   . Vertigo   . Hypertension   . Memory loss     "from [place] & old age; I guess" (11/11/2013)  . Arthritis     "qwhere"  . Urinary urgency     Surgeries: Procedure(s): RIGHT TOTAL KNEE ARTHROPLASTY on 11/10/2013   Consultants:    Discharged Condition: Improved  Hospital Course: Jennifer Atkins is an 79 y.o. female who was admitted 11/10/2013 for operative treatment of right knee osteoarthritis. Patient has severe unremitting pain that affects sleep, daily activities, and work/hobbies. After pre-op clearance the patient was taken to the operating room on 11/10/2013 and underwent  Procedure(s): RIGHT TOTAL KNEE ARTHROPLASTY.  Patient with a pre-op hgb of 14.6.  She developed ABLA with a hgb of 11.3 on POD #1.  She is asymptomatic but will continue to be followed.  Patient was given perioperative antibiotics:     Anti-infectives   Start     Dose/Rate Route Frequency Ordered Stop   11/10/13 1600  ceFAZolin (ANCEF) IVPB 1 g/50 mL premix     1 g 100 mL/hr over 30 Minutes Intravenous Every 6 hours 11/10/13 1514 11/10/13 2147   11/10/13 0600  ceFAZolin (ANCEF) IVPB 2 g/50 mL premix     2 g 100 mL/hr over 30 Minutes Intravenous On call to O.R. 11/09/13 1509 11/10/13 1017       Patient was given sequential compression devices, early ambulation, and chemoprophylaxis to prevent DVT.  Patient benefited maximally from hospital stay and there were no complications.    Recent vital signs:  Patient Vitals for the past 24 hrs:  BP Temp Temp src Pulse Resp SpO2  11/12/13 0648 115/51 mmHg 98.6 F (37 C) Oral 74 - 97 %  11/12/13 0400 - - - - 15 95 %  11/12/13 0000  - - - - 18 96 %  11/11/13 2058 121/49 mmHg 98.6 F (37 C) - 78 - 95 %  11/11/13 2000 - - - - 16 97 %     Recent laboratory studies:   Recent Labs  11/11/13 0815 11/12/13 0544  WBC 8.3 9.3  HGB 11.3* 11.3*  HCT 33.9* 34.5*  PLT 247 251  NA 142 140  K 4.4 4.5  CL 107 105  CO2 23 21  BUN 15 16  CREATININE 0.65 0.63  GLUCOSE 100* 110*  CALCIUM 8.5 8.8     Discharge Medications:     Medication List    STOP taking these medications       acetaminophen 650 MG CR tablet  Commonly known as:  TYLENOL     aspirin 81 MG tablet  Replaced by:  aspirin EC 325 MG tablet     fish oil-omega-3 fatty acids 1000 MG capsule     GLUCOSAMINE HCL PO      TAKE these medications       amLODipine 2.5 MG tablet  Commonly known as:  NORVASC  Take 2.5 mg by mouth daily.     aspirin EC 325 MG tablet  Take 1 tablet (325 mg total) by mouth daily.     bisacodyl 5 MG EC tablet  Commonly known as:  DULCOLAX  Take 1 tablet (  5 mg total) by mouth daily as needed for moderate constipation.     HYDROcodone-acetaminophen 7.5-325 MG per tablet  Commonly known as:  NORCO  Take 1-2 tablets by mouth every 4 (four) hours as needed for moderate pain.     MULTIVITAMIN PO  Take 1 tablet by mouth daily.     ondansetron 4 MG tablet  Commonly known as:  ZOFRAN  Take 1 tablet (4 mg total) by mouth every 8 (eight) hours as needed for nausea or vomiting.     polyvinyl alcohol-povidone 1.4-0.6 % ophthalmic solution  Commonly known as:  HYPOTEARS  Place 1 drop into both eyes 2 (two) times daily. Uses them 15 minutes after Restasis eye drops     RESTASIS 0.05 % ophthalmic emulsion  Generic drug:  cycloSPORINE  Place 1 drop into both eyes 2 (two) times daily.     solifenacin 5 MG tablet  Commonly known as:  VESICARE  Take 5 mg by mouth daily.     TUMS PO  Take 2 tablets by mouth 2 (two) times daily.     valsartan 320 MG tablet  Commonly known as:  DIOVAN  Take 320 mg by mouth daily.      VITAMIN D (CHOLECALCIFEROL) PO  Take 1 tablet by mouth 2 (two) times daily.        Diagnostic Studies: Dg Chest 2 View  11/02/2013   CLINICAL DATA:  Preop right total knee arthroplasty for osteoarthritis, hypertension  EXAM: CHEST  2 VIEW  COMPARISON:  None.  FINDINGS: Chronic interstitial markings/ emphysematous changes. No focal consolidation. No pleural effusion or pneumothorax.  The heart is normal in size.  Mild degenerative changes of the lower thoracolumbar spine.  IMPRESSION: No evidence of acute cardiopulmonary disease.   Electronically Signed   By: Julian Hy M.D.   On: 11/02/2013 15:05    Disposition: 01-Home or Self Care  Discharge Instructions   CPM    Complete by:  As directed   Continuous passive motion machine (CPM):      Use the CPM from 0- to 60 for 6 hours per day.      You may increase by 10 per day.  You may break it up into 2 or 3 sessions per day.      Use CPM for 2-3 weeks or until you are told to stop.     Call MD / Call 911    Complete by:  As directed   If you experience chest pain or shortness of breath, CALL 911 and be transported to the hospital emergency room.  If you develope a fever above 101 F, pus (white drainage) or increased drainage or redness at the wound, or calf pain, call your surgeon's office.     Change dressing    Complete by:  As directed   Change dressing on Saturday, then change the dressing daily with sterile 4 x 4 inch gauze dressing and apply TED hose.  You may clean the incision with alcohol prior to redressing.     Constipation Prevention    Complete by:  As directed   Drink plenty of fluids.  Prune juice may be helpful.  You may use a stool softener, such as Colace (over the counter) 100 mg twice a day.  Use MiraLax (over the counter) for constipation as needed.     Diet - low sodium heart healthy    Complete by:  As directed      Discharge instructions  Complete by:  As directed   Weight bearing as tolerated.  Take  Aspirin 1 tab a day for the next 30 days to prevent blood clots.  Change bandage daily starting on Saturday.  May shower on Monday, but do not soak incision.  May apply ice for up to 20 minutes at a time.  Follow up appointment in two weeks.     Do not put a pillow under the knee. Place it under the heel.    Complete by:  As directed   Place gray foam under operative heel when in bed or in a chair to work on extension     Increase activity slowly as tolerated    Complete by:  As directed      TED hose    Complete by:  As directed   Use stockings (TED hose) for 2 weeks on both leg(s).  You may remove them at night for sleeping.           Follow-up Information   Follow up with [facility], MD. Schedule an appointment as soon as possible for a visit in 2 weeks.   Specialty:  Orthopedic Surgery   Contact information:   [address] [address] [phone]        Signed: Larae Grooms 11/12/2013, 8:03 AM   \

## 2013-11-10 NOTE — Op Note (Signed)
NAME:  Jennifer Atkins, Jennifer Atkins NO.:  [id number]  MEDICAL RECORD NO.:  [id number]  LOCATION:  [facility]                         FACILITY:  [facility]  PHYSICIAN:  Ninetta Lights, M.D. DATE OF BIRTH:  August 20, 1934  DATE OF PROCEDURE:  11/10/2013 DATE OF DISCHARGE:                              OPERATIVE REPORT   PREOPERATIVE DIAGNOSIS:  Right knee primary localized end-stage degenerative arthritis, bone loss laterally, valgus alignment.  POSTOPERATIVE DIAGNOSIS:  Right knee primary localized end-stage degenerative arthritis, bone loss laterally, valgus alignment.  PROCEDURE:  Right knee modified minimally invasive total knee replacement utilizing Stryker triathlon prosthesis.  Soft tissue balancing.  Cemented pegged cruciate retaining #3 femoral component. Cemented #4 tibial component, 9 mm CS insert.  Cemented resurfacing 35- mm patellar component.  SURGEON:  Ninetta Lights, MD  ASSISTANT:  Doran Stabler, PA, present throughout the entire case and necessary for timely completion of procedure.  ANESTHESIA:  General.  BLOOD LOSS:  Minimal.  SPECIMENS:  None.  CULTURES:  None.  COMPLICATIONS:  None.  DRESSINGS:  Soft compressive knee immobilizer.  DRAINS:  Hemovac x1.  TOURNIQUET TIME:  45 minutes.  DESCRIPTION OF PROCEDURE:  The patient was brought to the operating room, placed on the operating table in supine position.  After adequate anesthesia had been obtained, tourniquet applied, prepped and draped in usual sterile fashion.  Exsanguinated with elevation of Esmarch. Tourniquet inflated to 350 mmHg.  Longitudinal incision above the patella down to tibial tubercle.  Medial arthrotomy, vastus splitting, preserving quad tendon.  Medial capsule release.  Some bone loss laterally not too extensive.  Flexible intramedullary guide distal femur.  An 8-mm resection 5 degrees of valgus.  Using epicondylar axis, the femur was sized, cut, and fitted for a pegged #3  cruciate retaining component.  Proximal tibial resection extramedullary guide.  Size #4 component.  Patella exposed, posterior 10 mm removed.  Drilled, sized, and fitted for a 35 mm component.  Trials put in place.  A 9-mm insert. With this construct nice biomechanical axis.  Nicely balanced in flexion and extension.  Full motion, good stability, good patellar tracking. Although she had excessive valgus, this was correctable, and there was not a significant soft tissue contracture.  Tibia was marked for rotation and reamed.  All trials removed.  Copious irrigation with a pulse irrigating device.  Cement prepared, placed on all components, firmly seated.  Polyethylene attached to tibia, knee reduced.  Patella with a clamp.  At completion, once cement hardened, the knee was irrigated again.  Soft tissues injected with Vicryl.  Hemovac placed. Arthrotomy closed with #1 Vicryl, skin and subcutaneous tissue with Vicryl subcutaneous and subcuticular.  Margins were injected with Marcaine.  Sterile compressive dressing applied.  Tourniquet deflated and removed.  Knee immobilizer applied.  Anesthesia reversed.  Brought to the recovery room.  Tolerated the surgery well.  No complications.     Ninetta Lights, M.D.     DFM/MEDQ  D:  11/10/2013  T:  11/10/2013  Job:  [id number]

## 2013-11-10 NOTE — Interval H&P Note (Signed)
History and Physical Interval Note:  11/10/2013 8:32 AM  Jennifer Atkins  has presented today for surgery, with the diagnosis of djd right knee  The various methods of treatment have been discussed with the patient and family. After consideration of risks, benefits and other options for treatment, the patient has consented to  Procedure(s): RIGHT TOTAL KNEE ARTHROPLASTY (Right) as a surgical intervention .  The patient's history has been reviewed, patient examined, no change in status, stable for surgery.  I have reviewed the patient's chart and labs.  Questions were answered to the patient's satisfaction.     Tyshika Baldridge F

## 2013-11-10 NOTE — H&P (View-Only) (Signed)
TOTAL KNEE ADMISSION H&P  Patient is being admitted for right total knee arthroplasty.  Subjective:  Chief Complaint:right knee pain.   HPI: Jennifer Atkins, 79 y.o. female, has a history of pain and functional disability in the right knee due to arthritis and has failed non-surgical conservative treatments for greater than 12 weeks to includeNSAID's and/or analgesics, corticosteriod injections and activity modification.  Onset of symptoms was gradual, starting >10 years ago with rapidlly worsening course since that time. The patient noted prior procedures on the knee to include  arthroscopy and menisectomy on the right knee(s).  Patient currently rates pain in the right knee(s) at 7 out of 10 with activity. Patient has night pain and worsening of pain with activity and weight bearing.  Patient has evidence of subchondral cysts, subchondral sclerosis and periarticular osteophytes by imaging studies. There is no active infection.  Patient Active Problem List   Diagnosis Date Noted  . MS (multiple sclerosis)   . Memory loss   . Numbness in feet 05/31/2013  . Multiple sclerosis 06/01/2012  . Abnormality of gait 06/01/2012   Past Medical History  Diagnosis Date  . MS (multiple sclerosis)   . Optic neuritis   . Vertigo   . Hypertension   . Memory loss     Past Surgical History  Procedure Laterality Date  . Vaginal hysterectomy    . Colon surgery    . Knee surgery Right   . Cataracts Bilateral      (Not in a hospital admission) No Known Allergies  History  Substance Use Topics  . Smoking status: Never Smoker   . Smokeless tobacco: Never Used  . Alcohol Use: 0.5 oz/week    1 drink(s) per week     Comment: wine daily    Family History  Problem Relation Age of Onset  . Cancer Father   . Leukemia Mother   . Heart disease    . Heart disease Maternal Grandmother      Review of Systems  Constitutional: Negative.   HENT: Negative.   Eyes: Negative.   Respiratory: Negative.    Cardiovascular: Negative.   Gastrointestinal: Negative.   Genitourinary: Positive for urgency and frequency. Negative for dysuria and hematuria.  Musculoskeletal: Positive for back pain and joint pain. Negative for myalgias.  Skin: Negative.   Neurological: Positive for dizziness. Negative for tingling and tremors.  Endo/Heme/Allergies: Bruises/bleeds easily.  Psychiatric/Behavioral: Negative.     Objective:  Physical Exam  Constitutional: She is oriented to person, place, and time. She appears well-developed and well-nourished.  HENT:  Head: Normocephalic and atraumatic.  Eyes: EOM are normal. Pupils are equal, round, and reactive to light.  Neck: Normal range of motion. Neck supple.  Cardiovascular: Normal rate and regular rhythm.  Exam reveals no gallop and no friction rub.   No murmur heard. Respiratory: Effort normal and breath sounds normal. No respiratory distress. She has no wheezes. She has no rales.  GI: Soft. Bowel sounds are normal.  Musculoskeletal:  Antalgic gait, valgus thrust on the right.  Fairly good strength.  No atrophy.  Right knee motion 0-115.  More than 12 degrees of valgus when she stands, but this is correctable.  Tibiofemoral and patellofemoral crepitus.  A little bit of valgus on the left, but nothing nearly as marked.    Neurological: She is alert and oriented to person, place, and time.  Skin: Skin is warm and dry.  Psychiatric: She has a normal mood and affect. Her behavior is normal.  Judgment and thought content normal.    Vital signs in last 24 hours: @VSRANGES @  Labs:   Estimated body mass index is 25.13 kg/(m^2) as calculated from the following:   Height as of 07/01/13: 5\' 5"  (1.651 m).   Weight as of 07/01/13: 68.493 kg (151 lb).   Imaging Review Plain radiographs demonstrate severe degenerative joint disease of the right knee(s). The overall alignment ismild valgus. The bone quality appears to be fair for age and reported activity  level.  Assessment/Plan:  End stage arthritis, right knee   The patient history, physical examination, clinical judgment of the provider and imaging studies are consistent with end stage degenerative joint disease of the right knee(s) and total knee arthroplasty is deemed medically necessary. The treatment options including medical management, injection therapy arthroscopy and arthroplasty were discussed at length. The risks and benefits of total knee arthroplasty were presented and reviewed. The risks due to aseptic loosening, infection, stiffness, patella tracking problems, thromboembolic complications and other imponderables were discussed. The patient acknowledged the explanation, agreed to proceed with the plan and consent was signed. Patient is being admitted for inpatient treatment for surgery, pain control, PT, OT, prophylactic antibiotics, VTE prophylaxis, progressive ambulation and ADL's and discharge planning. The patient is planning to be discharged to skilled nursing facility

## 2013-11-10 NOTE — Anesthesia Preprocedure Evaluation (Addendum)
Anesthesia Evaluation  Patient identified by MRN, date of birth, ID band Patient awake    Reviewed: Allergy & Precautions, H&P , NPO status , Patient's Chart, lab work & pertinent test results  History of Anesthesia Complications (+) PONV and history of anesthetic complications  Airway Mallampati: II TM Distance: <3 FB Neck ROM: Full    Dental  (+) Teeth Intact   Pulmonary neg pulmonary ROS,  breath sounds clear to auscultation        Cardiovascular hypertension, Pt. on medications Rhythm:Regular     Neuro/Psych MS no modulators or recent exacerbations, no bulbar symptoms, no steroids  negative psych ROS   GI/Hepatic Neg liver ROS,   Endo/Other  negative endocrine ROS  Renal/GU negative Renal ROS     Musculoskeletal  (+) Arthritis -, Osteoarthritis,    Abdominal   Peds  Hematology negative hematology ROS (+)   Anesthesia Other Findings   Reproductive/Obstetrics                          Anesthesia Physical Anesthesia Plan  ASA: III  Anesthesia Plan: General   Post-op Pain Management:    Induction: Intravenous  Airway Management Planned: LMA  Additional Equipment: None  Intra-op Plan:   Post-operative Plan: Extubation in OR  Informed Consent: I have reviewed the patients History and Physical, chart, labs and discussed the procedure including the risks, benefits and alternatives for the proposed anesthesia with the patient or authorized representative who has indicated his/her understanding and acceptance.   Dental advisory given  Plan Discussed with: CRNA and Surgeon  Anesthesia Plan Comments:         Anesthesia Quick Evaluation

## 2013-11-10 NOTE — Transfer of Care (Signed)
Immediate Anesthesia Transfer of Care Note  Patient: Jennifer Atkins  Procedure(s) Performed: Procedure(s): RIGHT TOTAL KNEE ARTHROPLASTY (Right)  Patient Location: PACU  Anesthesia Type:General  Level of Consciousness: awake, alert  and oriented  Airway & Oxygen Therapy: Patient Spontanous Breathing and Patient connected to nasal cannula oxygen  Post-op Assessment: Report given to PACU RN and Post -op Vital signs reviewed and stable  Post vital signs: Reviewed and stable  Complications: No apparent anesthesia complications

## 2013-11-10 NOTE — Plan of Care (Signed)
Problem: Consults Goal: Diagnosis- Total Joint Replacement Primary Total Knee Right     

## 2013-11-10 NOTE — Discharge Instructions (Signed)
Total Knee Replacement, Care After Refer to this sheet in the next few weeks. These instructions provide you with information on caring for yourself after your procedure. Your health care provider also may give you specific instructions. Your treatment has been planned according to the most current medical practices, but problems sometimes occur. Call your health care provider if you have any problems or questions after your procedure. HOME CARE INSTRUCTIONS   Weight bearing as tolerated.  Take Aspirin 325 mg 1 tab a day for the next 30 days to prevent blood clots.  Change bandage daily starting on Saturday.  May shower on Monday, but do not soak incision.  May apply ice for up to 20 minutes at a time for pain and swelling.  Follow up appointment in two weeks.    See a physical therapist as directed by your health care provider.  Take medicines only as directed by your health care provider.  Avoid lifting or driving until you are instructed otherwise.  If you have been sent home with a continuous passive motion machine, use it as directed by your health care provider. SEEK MEDICAL CARE IF:  You have difficulty breathing.  You have drainage, redness, swelling, or pain at your incision site.  You have a bad smell coming from your incision site.  You have persistent bleeding from your incision site.  Your incision breaks open after sutures (stitches) or staples have been removed.  You have a fever. SEEK IMMEDIATE MEDICAL CARE IF:   You have a rash.  You have pain or swelling in your calf or thigh.  You have shortness of breath or chest pain.  Your range of motion in your knee is decreasing rather than increasing. MAKE SURE YOU:   Understand these instructions.  Will watch your condition.  Will get help right away if you are not doing well or get worse. Document Released: 07/27/2004 Document Revised: 05/24/2013 Document Reviewed: 02/26/2011 [facility] Patient Information 2015  [place], [place]. This information is not intended to replace advice given to you by your health care provider. Make sure you discuss any questions you have with your health care provider.

## 2013-11-10 NOTE — Progress Notes (Signed)
Utilization review completed.  

## 2013-11-10 NOTE — Anesthesia Postprocedure Evaluation (Signed)
  Anesthesia Post-op Note  Patient: Jennifer Atkins  Procedure(s) Performed: Procedure(s): RIGHT TOTAL KNEE ARTHROPLASTY (Right)  Patient Location: PACU  Anesthesia Type:General  Level of Consciousness: awake  Airway and Oxygen Therapy: Patient Spontanous Breathing  Post-op Pain: moderate  Post-op Assessment: Post-op Vital signs reviewed, Patient's Cardiovascular Status Stable, Respiratory Function Stable, Patent Airway, No signs of Nausea or vomiting and Pain level controlled  Post-op Vital Signs: Reviewed and stable  Last Vitals:  Filed Vitals:   11/10/13 1245  BP:   Pulse: 61  Temp:   Resp: 16    Complications: No apparent anesthesia complications

## 2013-11-11 ENCOUNTER — Encounter (HOSPITAL_COMMUNITY): Payer: Self-pay | Admitting: General Practice

## 2013-11-11 LAB — BASIC METABOLIC PANEL
ANION GAP: 12 (ref 5–15)
BUN: 15 mg/dL (ref 6–23)
CHLORIDE: 107 meq/L (ref 96–112)
CO2: 23 mEq/L (ref 19–32)
Calcium: 8.5 mg/dL (ref 8.4–10.5)
Creatinine, Ser: 0.65 mg/dL (ref 0.50–1.10)
GFR calc Af Amer: >90 mL/min (ref >90)
GFR calc non Af Amer: 84 mL/min — ABNORMAL LOW (ref >90)
Glucose, Bld: 100 mg/dL — ABNORMAL HIGH (ref 70–99)
Potassium: 4.4 mEq/L (ref 3.7–5.3)
SODIUM: 142 meq/L (ref 137–147)

## 2013-11-11 LAB — CBC
HEMATOCRIT: 33.9 % — AB (ref 36.0–46.0)
Hemoglobin: 11.3 g/dL — ABNORMAL LOW (ref 12.0–15.0)
MCH: 33.3 pg (ref 26.0–34.0)
MCHC: 33.3 g/dL (ref 30.0–36.0)
MCV: 100 fL (ref 78.0–100.0)
Platelets: 247 10*3/uL (ref 150–400)
RBC: 3.39 MIL/uL — ABNORMAL LOW (ref 3.87–5.11)
RDW: 12.9 % (ref 11.5–15.5)
WBC: 8.3 10*3/uL (ref 4.0–10.5)

## 2013-11-11 NOTE — Evaluation (Signed)
Physical Therapy Evaluation Patient Details Name: COREE RIESTER MRN: [id number] DOB: 1934-05-14 Today's Date: 11/11/2013   History of Present Illness  79 y.o. female s/p right total knee arthroplasty with hx of multiple sclerosis.  Clinical Impression  Pt is s/p right TKA resulting in the deficits listed below (see PT Problem List). Tolerated gait training up to 15 feet today, transfer training, and therapeutic exercises.  She lives alone and will need short term SNF prior to returning home. Pt will benefit from skilled PT to increase their independence and safety with mobility to allow discharge to the venue listed below.       Follow Up Recommendations SNF;Supervision for mobility/OOB    Equipment Recommendations  3in1 (PT)    Recommendations for Other Services       Precautions / Restrictions Precautions Precautions: Knee Precaution Booklet Issued: Yes (comment) Precaution Comments: Reviewed knee precautions Required Braces or Orthoses: Knee Immobilizer - Right Restrictions Weight Bearing Restrictions: Yes RLE Weight Bearing: Weight bearing as tolerated      Mobility  Bed Mobility Overal bed mobility: Needs Assistance Bed Mobility: Supine to Sit     Supine to sit: Min guard     General bed mobility comments: Min guard for safety. VC for technique. Requires extra time and use of rail.  Transfers Overall transfer level: Needs assistance Equipment used: Rolling walker (2 wheeled) Transfers: Sit to/from Stand Sit to Stand: Min assist         General transfer comment: Min assist for boost to stand. VC for hand placement. Encouraged to increase WB through RLE.  Ambulation/Gait Ambulation/Gait assistance: Min guard Ambulation Distance (Feet): 15 Feet Assistive device: Rolling walker (2 wheeled) Gait Pattern/deviations: Step-to pattern;Decreased step length - left;Decreased stance time - right;Antalgic;Trunk flexed   Gait velocity interpretation: Below  normal speed for age/gender General Gait Details: Educated on safe DME use with a rolling walker. VC for upright posture, forward gaze, and to extend right knee in stance phase for quad activation. No instances of buckling noted during this short bout. Min guard for safety.  Stairs            Wheelchair Mobility    Modified Rankin (Stroke Patients Only)       Balance Overall balance assessment: Needs assistance Sitting-balance support: No upper extremity supported;Feet supported Sitting balance-Leahy Scale: Fair     Standing balance support: Bilateral upper extremity supported Standing balance-Leahy Scale: Poor                               Pertinent Vitals/Pain Pain Assessment: 0-10 Pain Score: 7  Pain Location: Rt knee Pain Descriptors / Indicators: Aching Pain Intervention(s): Monitored during session;Repositioned;Premedicated before session    Home Living Family/patient expects to be discharged to:: [facility]isted living ([facility]) Living Arrangements: Alone Available Help at Discharge: [place] Type of Home: House Home Access: Stairs to enter Entrance Stairs-Rails: None Entrance Stairs-Number of Steps: 1 Home Layout: One level Home Equipment: Cane - single point;Cane - quad;Walker - 2 wheels      Prior Function Level of Independence: Independent with assistive device(s)         Comments: cane for ambulation recently     Hand Dominance   Dominant Hand: Right    Extremity/Trunk Assessment   Upper Extremity Assessment: Defer to OT evaluation           Lower Extremity Assessment: RLE deficits/detail RLE Deficits / Details: decreased  strength and ROM as expected post op       Communication   Communication: No difficulties  Cognition Arousal/Alertness: Awake/alert Behavior During Therapy: WFL for tasks assessed/performed Overall Cognitive Status: Within Functional Limits for tasks assessed                       General Comments      Exercises Total Joint Exercises Ankle Circles/Pumps: AROM;Both;10 reps;Supine Quad Sets: AROM;Both;10 reps;Seated      Assessment/Plan    PT Assessment Patient needs continued PT services  PT Diagnosis Difficulty walking;Abnormality of gait;Acute pain   PT Problem List Decreased strength;Decreased range of motion;Decreased activity tolerance;Decreased balance;Decreased mobility;Decreased knowledge of use of DME;Decreased knowledge of precautions;Pain  PT Treatment Interventions DME instruction;Gait training;Functional mobility training;Therapeutic activities;Therapeutic exercise;Balance training;Neuromuscular re-education;Patient/family education;Modalities   PT Goals (Current goals can be found in the Care Plan section) Acute Rehab PT Goals Patient Stated Goal: Go to rehab PT Goal Formulation: With patient Time For Goal Achievement: 11/18/13 Potential to Achieve Goals: Good    Frequency 7X/week   Barriers to discharge Decreased caregiver support lives alone    Co-evaluation               End of Session Equipment Utilized During Treatment: Gait belt Activity Tolerance: Patient tolerated treatment well Patient left: in chair;with call bell/phone within reach;with family/visitor present Nurse Communication: Mobility status         Time: 7482-7078 PT Time Calculation (min): 21 min   Charges:   PT Evaluation $Initial PT Evaluation Tier I: 1 Procedure PT Treatments $Therapeutic Activity: 8-22 mins   PT G Codes:         [employer], Toyah  Ellouise Newer 11/11/2013, 1:05 PM

## 2013-11-11 NOTE — Progress Notes (Signed)
Subjective: 1 Day Post-Op Procedure(s) (LRB): RIGHT TOTAL KNEE ARTHROPLASTY (Right) Patient reports pain as 2 on 0-10 scale.  Minimal pain reported.  No nausea/vomiting, lightheadedness/dizziness.  Positive flatus.  Good appetite and tolerating diet.   Objective: Vital signs in last 24 hours: Temp:  [97 F (36.1 C)-98 F (36.7 C)] 98 F (36.7 C) (10/22 0544) Pulse Rate:  [55-96] 96 (10/22 0544) Resp:  [10-19] 14 (10/21 1512) BP: (105-142)/(49-78) 110/49 mmHg (10/22 0544) SpO2:  [66 %-100 %] 66 % (10/22 0544) Weight:  [68.402 kg (150 lb 12.8 oz)] 68.402 kg (150 lb 12.8 oz) (10/21 0826)  Intake/Output from previous day: 10/21 0701 - 10/22 0700 In: 1760 [P.O.:360; I.V.:1350; IV Piggyback:50] Out: 855 [Drains:575; Blood:280] Intake/Output this shift: Total I/O In: -  Out: 200 [Drains:200]  No results found for this basename: HGB,  in the last 72 hours No results found for this basename: WBC, RBC, HCT, PLT,  in the last 72 hours No results found for this basename: NA, K, CL, CO2, BUN, CREATININE, GLUCOSE, CALCIUM,  in the last 72 hours No results found for this basename: LABPT, INR,  in the last 72 hours  Neurologically intact Neurovascular intact Sensation intact distally Intact pulses distally Dorsiflexion/Plantar flexion intact Compartment soft Negative homans bilaterally hemovac drain pulled by me today  Assessment/Plan: 1 Day Post-Op Procedure(s) (LRB): RIGHT TOTAL KNEE ARTHROPLASTY (Right) Advance diet Up with therapy D/C IV fluids Plan for discharge tomorrow or Saturday pending insurance required length of stay to [place] 11/11/2013, 6:54 AM

## 2013-11-11 NOTE — Clinical Social Work Placement (Addendum)
Clinical Social Work Department CLINICAL SOCIAL WORK PLACEMENT NOTE 11/11/2013  Patient:  Jennifer Atkins, Jennifer Atkins  Account Number:  [id number] Admit date:  11/10/2013  Clinical Social Worker:  Delrae Sawyers  Date/time:  11/11/2013 02:42 PM  Clinical Social Work is seeking post-discharge placement for this patient at the following level of care:   [place]   (*CSW will update this form in Epic as items are completed)   11/11/2013  Patient/family provided with [place] Department of Clinical Social Work's list of facilities offering this level of care within the geographic area requested by the patient (or if unable, by the patient's family).  11/11/2013  Patient/family informed of their freedom to choose among providers that offer the needed level of care, that participate in Medicare, Medicaid or managed care program needed by the patient, have an available bed and are willing to accept the patient.  11/11/2013  Patient/family informed of [facility]' ownership interest in [facility], as well as of the fact that they are under no obligation to receive care at this facility.  PASARR submitted to EDS on 11/11/2013 PASARR number received on 11/11/2013  FL2 transmitted to all facilities in geographic area requested by pt/family on  11/11/2013 FL2 transmitted to all facilities within larger geographic area on   Patient informed that his/her managed care company has contracts with or will negotiate with  certain facilities, including the following:     Patient/family informed of bed offers received:  11/11/2013 Patient chooses bed at [place] Physician recommends and patient chooses bed at    Patient to be transferred to [place] on   Patient to be transferred to facility by Private vehicle (pt's children driving patient) Patient and family notified of transfer on 11/12/2013 Name of family member notified:  Pt notified at bedside.  The following  physician request were entered in Epic:   Additional Comments:  [place] [phone]) Licensed Clinical Social Worker Orthopedics [phone]) and Surgical [phone])

## 2013-11-11 NOTE — Clinical Social Work Psychosocial (Signed)
Clinical Social Work Department BRIEF PSYCHOSOCIAL ASSESSMENT 11/11/2013  Patient:  Jennifer Atkins, Jennifer Atkins     Account Number:  [id number]     Admit date:  11/10/2013  Clinical Social Worker:  Delrae Sawyers  Date/Time:  11/11/2013 02:38 PM  Referred by:  Physician  Date Referred:  11/11/2013 Referred for  SNF Placement   Other Referral:   none.   Interview type:  Patient Other interview type:   Pt's daughter also present at bedside, but did not participate in assessment.    PSYCHOSOCIAL DATA Living Status:  ALONE Admitted from facility:   Level of care:   Primary support name:  Raymon Mutton Primary support relationship to patient:  CHILD, ADULT Degree of support available:   Strong support system.    CURRENT CONCERNS Current Concerns  Post-Acute Placement   Other Concerns:   none.    SOCIAL WORK ASSESSMENT / PLAN CSW received referral stating pt is with bundle service and will discharge to [facility]. CSW met with pt at bedside to confirm discharge disposition. Pt states she is pre-registered with [facility]. CSW to continue to follow and assist with discharge planning needs.   Assessment/plan status:  Psychosocial Support/Ongoing Assessment of Needs Other assessment/ plan:   none.   Information/referral to community resources:   Pt to be discharged to [facility].    PATIENT'S/FAMILY'S RESPONSE TO PLAN OF CARE: Pt understanding and agreeable to CSW plan of care. Pt expressed no further questions or concerns at this time.       Lubertha Sayres, [place] [phone]) Licensed Clinical Social Worker Orthopedics [phone]) and Surgical [phone])

## 2013-11-11 NOTE — Progress Notes (Signed)
OT Cancellation Note  Patient Details Name: Jennifer Atkins MRN: [id number] DOB: April 26, 1934   Cancelled Treatment:    Reason Eval/Treat Not Completed: Other (comment) Pt is Medicare and current D/C plan is SNF. No apparent immediate acute care OT needs, therefore will defer OT to SNF. If OT eval is needed please call Acute Rehab Dept. at [phone] or text page OT at [phone].     [name] [phone] 11/11/2013, 1:16 PM

## 2013-11-12 ENCOUNTER — Encounter (HOSPITAL_COMMUNITY): Payer: Self-pay | Admitting: Orthopedic Surgery

## 2013-11-12 DIAGNOSIS — H469 Unspecified optic neuritis: Secondary | ICD-10-CM | POA: Diagnosis not present

## 2013-11-12 DIAGNOSIS — D62 Acute posthemorrhagic anemia: Secondary | ICD-10-CM | POA: Diagnosis not present

## 2013-11-12 DIAGNOSIS — I10 Essential (primary) hypertension: Secondary | ICD-10-CM | POA: Diagnosis not present

## 2013-11-12 DIAGNOSIS — M1990 Unspecified osteoarthritis, unspecified site: Secondary | ICD-10-CM | POA: Diagnosis not present

## 2013-11-12 DIAGNOSIS — R350 Frequency of micturition: Secondary | ICD-10-CM | POA: Diagnosis not present

## 2013-11-12 DIAGNOSIS — G35 Multiple sclerosis: Secondary | ICD-10-CM | POA: Diagnosis not present

## 2013-11-12 DIAGNOSIS — M1711 Unilateral primary osteoarthritis, right knee: Secondary | ICD-10-CM | POA: Diagnosis not present

## 2013-11-12 DIAGNOSIS — R262 Difficulty in walking, not elsewhere classified: Secondary | ICD-10-CM | POA: Diagnosis not present

## 2013-11-12 DIAGNOSIS — Z471 Aftercare following joint replacement surgery: Secondary | ICD-10-CM | POA: Diagnosis not present

## 2013-11-12 DIAGNOSIS — M6281 Muscle weakness (generalized): Secondary | ICD-10-CM | POA: Diagnosis not present

## 2013-11-12 DIAGNOSIS — R531 Weakness: Secondary | ICD-10-CM | POA: Diagnosis not present

## 2013-11-12 DIAGNOSIS — R32 Unspecified urinary incontinence: Secondary | ICD-10-CM | POA: Diagnosis not present

## 2013-11-12 DIAGNOSIS — K5900 Constipation, unspecified: Secondary | ICD-10-CM | POA: Diagnosis not present

## 2013-11-12 DIAGNOSIS — Z96651 Presence of right artificial knee joint: Secondary | ICD-10-CM | POA: Diagnosis not present

## 2013-11-12 LAB — CBC
HCT: 34.5 % — ABNORMAL LOW (ref 36.0–46.0)
HEMOGLOBIN: 11.3 g/dL — AB (ref 12.0–15.0)
MCH: 33 pg (ref 26.0–34.0)
MCHC: 32.8 g/dL (ref 30.0–36.0)
MCV: 100.9 fL — ABNORMAL HIGH (ref 78.0–100.0)
PLATELETS: 251 10*3/uL (ref 150–400)
RBC: 3.42 MIL/uL — ABNORMAL LOW (ref 3.87–5.11)
RDW: 13.2 % (ref 11.5–15.5)
WBC: 9.3 10*3/uL (ref 4.0–10.5)

## 2013-11-12 LAB — BASIC METABOLIC PANEL
Anion gap: 14 (ref 5–15)
BUN: 16 mg/dL (ref 6–23)
CO2: 21 meq/L (ref 19–32)
Calcium: 8.8 mg/dL (ref 8.4–10.5)
Chloride: 105 mEq/L (ref 96–112)
Creatinine, Ser: 0.63 mg/dL (ref 0.50–1.10)
GFR calc Af Amer: >90 mL/min (ref >90)
GFR, EST NON AFRICAN AMERICAN: 85 mL/min — AB (ref >90)
GLUCOSE: 110 mg/dL — AB (ref 70–99)
POTASSIUM: 4.5 meq/L (ref 3.7–5.3)
Sodium: 140 mEq/L (ref 137–147)

## 2013-11-12 MED ORDER — LOPERAMIDE HCL 2 MG PO CAPS
2.0000 mg | ORAL_CAPSULE | ORAL | Status: DC | PRN
  Administered 2013-11-12: 2 mg via ORAL
  Filled 2013-11-12 (×2): qty 2

## 2013-11-12 NOTE — Progress Notes (Signed)
Physical Therapy Treatment Patient Details Name: Jennifer Atkins MRN: [id number] DOB: [birth year] Today's Date: 11/12/2013    History of Present Illness 79 y.o. female s/p right total knee arthroplasty with hx of multiple sclerosis.    PT Comments    Progressing with ambulation and exercise this session.  Had light headed event earlier up to bathroom.  BP WNL for mobility this session without complaints.  Did educate re-[place] when toileting.  Needs SNF rehab at d/c.  Discusses method for car transfers with daughter present as plans to go in private vehicle to SNF today.  Follow Up Recommendations  SNF     Equipment Recommendations  3in1 (PT);Rolling walker with 5" wheels    Recommendations for Other Services       Precautions / Restrictions Precautions Precautions: Knee Precaution Comments: Reviewed knee precautions Required Braces or Orthoses: Knee Immobilizer - Right Restrictions RLE Weight Bearing: Weight bearing as tolerated    Mobility  Bed Mobility Overal bed mobility: Needs Assistance Bed Mobility: Supine to Sit     Supine to sit: Min guard        Transfers   Equipment used: Rolling walker (2 wheeled) Transfers: Sit to/from Stand Sit to Stand: Min assist         General transfer comment: cues for hand placement  Ambulation/Gait Ambulation/Gait assistance: Min guard;Min assist Ambulation Distance (Feet): 130 Feet Assistive device: Rolling walker (2 wheeled) Gait Pattern/deviations: Step-through pattern;Antalgic;Decreased stride length   Gait velocity interpretation: Below normal speed for age/gender General Gait Details: Cues for posture, walking distance and walker use   Stairs            Wheelchair Mobility    Modified Rankin (Stroke Patients Only)       Balance   Sitting-balance support: Feet supported;No upper extremity supported Sitting balance-Leahy Scale: Fair     Standing balance support: Single extremity  supported Standing balance-Leahy Scale: Fair                      Cognition Arousal/Alertness: Awake/alert Behavior During Therapy: WFL for tasks assessed/performed Overall Cognitive Status: Within Functional Limits for tasks assessed                      Exercises Total Joint Exercises Ankle Circles/Pumps: AROM;Supine;10 reps;Both Quad Sets: AROM;Supine;Right;10 reps Short Arc Quad: AROM;Right;10 reps;Other reps (comment);Supine Heel Slides: AROM;Right;10 reps;Supine Hip ABduction/ADduction: AROM;Right;10 reps;Supine Straight Leg Raises: AAROM;Right;10 reps    General Comments        Pertinent Vitals/Pain Pain Score: 5  Pain Location: right knee Pain Intervention(s): Monitored during session    Home Living                      Prior Function            PT Goals (current goals can now be found in the care plan section) Progress towards PT goals: Progressing toward goals    Frequency  7X/week    PT Plan Current plan remains appropriate    Co-evaluation             End of Session Equipment Utilized During Treatment: Gait belt;Right knee immobilizer Activity Tolerance: Patient tolerated treatment well Patient left: in chair;with call bell/phone within reach;with family/visitor present     Time: 1002-1040 PT Time Calculation (min): 38 min  Charges:  $Gait Training: 23-37 mins $Therapeutic Exercise: 8-22 mins  G Codes:      Destane Speas,CYNDI 11/12/2013, 3:17 PM Magda Kiel, [place] 11/12/2013

## 2013-11-12 NOTE — Progress Notes (Signed)
Subjective: 2 Days Post-Op Procedure(s) (LRB): RIGHT TOTAL KNEE ARTHROPLASTY (Right) Patient reports pain as 2 on 0-10 scale.  Moderate nausea yesterday afternoon but no vomiting.  No lightheadedness/dizziness.  Tolerating diet.  Objective: Vital signs in last 24 hours: Temp:  [98.6 F (37 C)] 98.6 F (37 C) (10/23 0648) Pulse Rate:  [74-78] 74 (10/23 0648) Resp:  [15-18] 15 (10/23 0400) BP: (115-121)/(49-51) 115/51 mmHg (10/23 0648) SpO2:  [95 %-97 %] 97 % (10/23 0648)  Intake/Output from previous day: 10/22 0701 - 10/23 0700 In: 720 [P.O.:720] Out: -  Intake/Output this shift:     Recent Labs  11/11/13 0815 11/12/13 0544  HGB 11.3* 11.3*    Recent Labs  11/11/13 0815 11/12/13 0544  WBC 8.3 9.3  RBC 3.39* 3.42*  HCT 33.9* 34.5*  PLT 247 251    Recent Labs  11/11/13 0815 11/12/13 0544  NA 142 140  K 4.4 4.5  CL 107 105  CO2 23 21  BUN 15 16  CREATININE 0.65 0.63  GLUCOSE 100* 110*  CALCIUM 8.5 8.8   No results found for this basename: LABPT, INR,  in the last 72 hours  Neurologically intact Neurovascular intact Sensation intact distally Intact pulses distally Dorsiflexion/Plantar flexion intact Incision: scant drainage No cellulitis present Compartment soft Negative homans bilaterally Dressing changed by me today  Assessment/Plan: 2 Days Post-Op Procedure(s) (LRB): RIGHT TOTAL KNEE ARTHROPLASTY (Right) Advance diet Up with therapy Discharge to SNF today or tomorrow pending insurance approval WBAT RLE Please place SCDs while in bed Please use CPM for 6 hours today Please use zero degree bone foam Please place compression stockings RLE   Jennifer Atkins, Jennifer Atkins 11/12/2013, 7:39 AM

## 2013-11-12 NOTE — Clinical Social Work Note (Signed)
Pt to be discharged to [facility]. Pt notified at bedside.  [facility] SNF: [phone] Transportation: pt's daughter transporting.  Lubertha Sayres, [place] ([phone]) Licensed Clinical Social Worker Orthopedics [phone]) and Surgical [phone])

## 2013-11-12 NOTE — Care Management Note (Signed)
CARE MANAGEMENT NOTE 11/12/2013  Patient:  Jennifer Atkins, Jennifer Atkins   Account Number:  [id number]  Date Initiated:  11/11/2013  Documentation initiated by:  Ricki Miller  Subjective/Objective Assessment:   79 yr old female admitted with DJD of right knee. Patient underwent right total knee arthroplasty.     Action/Plan:   Patient will need shortterm rehab at SNF, will go to [facility], Social worker has arranged.   Anticipated DC Date:  11/12/2013   Anticipated DC Plan:  SKILLED NURSING FACILITY  In-house referral  Clinical Social Worker      DC Planning Services  CM consult      [facility] Choice  NA   Choice offered to / List presented to:  NA   DME arranged  NA        HH arranged  NA      Status of service:  Completed, signed off Medicare Important Message given?  NA - LOS <3 / Initial given by admissions (If response is "NO", the following Medicare IM given date fields will be blank) Date Medicare IM given:   Medicare IM given by:   Date Additional Medicare IM given:   Additional Medicare IM given by:    Discharge Disposition:  [place]  Per UR Regulation:  Reviewed for med. necessity/level of care/duration of stay  If discussed at [place] of Stay Meetings, dates discussed

## 2013-11-15 ENCOUNTER — Non-Acute Institutional Stay (SKILLED_NURSING_FACILITY): Payer: Medicare Other | Admitting: Adult Health

## 2013-11-15 ENCOUNTER — Encounter: Payer: Self-pay | Admitting: Adult Health

## 2013-11-15 DIAGNOSIS — R350 Frequency of micturition: Secondary | ICD-10-CM | POA: Diagnosis not present

## 2013-11-15 DIAGNOSIS — I10 Essential (primary) hypertension: Secondary | ICD-10-CM

## 2013-11-15 DIAGNOSIS — M1711 Unilateral primary osteoarthritis, right knee: Secondary | ICD-10-CM | POA: Diagnosis not present

## 2013-11-15 DIAGNOSIS — G35 Multiple sclerosis: Secondary | ICD-10-CM | POA: Diagnosis not present

## 2013-11-15 NOTE — Progress Notes (Signed)
Patient ID: Jennifer Atkins, female   DOB: [birth year], 79 y.o.   MRN: [id number]   11/15/2013  Facility:  Nursing Home Location:  [place] Room Number: 267-T LEVEL OF CARE:  SNF (31)   Chief Complaint  Patient presents with  . New Admit To SNF    HISTORY OF PRESENT ILLNESS:  This is a 79 year old female who has been admitted to [facility] on 11/12/13 from [facility] with DJD S/P right total knee arthroplasty. She has been admitted for a short-term rehabilitation.  REASSESSMENT OF ONGOING PROBLEMS:  MULTIPLE SCLEROSIS:  No complaints of tingling nor tremors. Currently not on any medication for MS.  HTN: Pt 's HTN remains stable.  Denies CP, sob, DOE, headaches, dizziness or visual disturbances.  No complications from the medications currently being used.  Last BP : 134/72  URINARY FREQUENCY: The urinary frequency remains stable.  Patient denies worsening frequency, leakage or nocturia.  No complications reported from the medication currently being used.  PAST MEDICAL HISTORY:  Past Medical History  Diagnosis Date  . MS (multiple sclerosis)   . Optic neuritis   . Vertigo   . Hypertension   . Memory loss     "from Ray & old age; I guess" (11/11/2013)  . Arthritis     "qwhere"  . Urinary urgency     CURRENT MEDICATIONS: Reviewed per MAR/see medication list  Allergies  Allergen Reactions  . Latex Rash    Band-aids      REVIEW OF SYSTEMS:  GENERAL: no change in appetite, no fatigue, no weight changes, no fever, chills or weakness RESPIRATORY: no cough, SOB, DOE, wheezing, hemoptysis CARDIAC: no chest pain, or palpitations, +edema GI: no abdominal pain, diarrhea, constipation, heart burn, nausea or vomiting  PHYSICAL EXAMINATION  GENERAL: no acute distress, normal body habitus EYES: conjunctivae normal, sclerae normal, normal eye lids NECK: supple, trachea midline, no neck masses, no thyroid tenderness, no  thyromegaly LYMPHATICS: no LAN in the neck, no supraclavicular LAN RESPIRATORY: breathing is even & unlabored, BS CTAB CARDIAC: RRR, no murmur,no extra heart sounds, RLE edema 1+ GI: abdomen soft, normal BS, no masses, no tenderness, no hepatomegaly, no splenomegaly EXTREMITIES: able to move all 4 extremities PSYCHIATRIC: the patient is alert & oriented to person, affect & behavior appropriate  LABS/RADIOLOGY: Labs reviewed: Basic Metabolic Panel:  Recent Labs  11/02/13 1149 11/11/13 0815 11/12/13 0544  NA 138 142 140  K 4.5 4.4 4.5  CL 103 107 105  CO2 20 23 21   GLUCOSE 101* 100* 110*  BUN 23 15 16   CREATININE 0.61 0.65 0.63  CALCIUM 9.8 8.5 8.8   Liver Function Tests:  Recent Labs  11/02/13 1149  AST 28  ALT 19  ALKPHOS 73  BILITOT 1.0  PROT 7.3  ALBUMIN 4.1   CBC:  Recent Labs  11/02/13 1149 11/11/13 0815 11/12/13 0544  WBC 6.3 8.3 9.3  NEUTROABS 3.4  --   --   HGB 14.6 11.3* 11.3*  HCT 43.0 33.9* 34.5*  MCV 97.1 100.0 100.9*  PLT 278 247 251    Dg Chest 2 View  11/02/2013   CLINICAL DATA:  Preop right total knee arthroplasty for osteoarthritis, hypertension  EXAM: CHEST  2 VIEW  COMPARISON:  None.  FINDINGS: Chronic interstitial markings/ emphysematous changes. No focal consolidation. No pleural effusion or pneumothorax.  The heart is normal in size.  Mild degenerative changes of the lower thoracolumbar spine.  IMPRESSION: No evidence of  acute cardiopulmonary disease.   Electronically Signed   By: Julian Hy M.D.   On: 11/02/2013 15:05   Dg Knee Right Port  11/10/2013   CLINICAL DATA:  Status post right total knee arthroplasty.  EXAM: PORTABLE RIGHT KNEE - 1-2 VIEW  COMPARISON:  None.  FINDINGS: Status post right total knee arthroplasty. The femoral and tibial components appear to be well situated. Surgical drain is seen in the suprapatellar space.  IMPRESSION: Status post left total knee arthroplasty.   Electronically Signed   By: Sabino Dick  M.D.   On: 11/10/2013 12:31    ASSESSMENT/PLAN:  DJD S/P right total knee arthroplasty - for rehabilitation Multiple Sclerosis - stable Hypertension - well-controlled; continue Amlodipine and Diovan Urinary frequency - continue Vesicare   CPT CODE: 26948    Jennifer Atkins,Jennifer Atkins, Jennifer Atkins

## 2013-11-16 ENCOUNTER — Non-Acute Institutional Stay (SKILLED_NURSING_FACILITY): Payer: Medicare Other | Admitting: Internal Medicine

## 2013-11-16 DIAGNOSIS — D62 Acute posthemorrhagic anemia: Secondary | ICD-10-CM

## 2013-11-16 DIAGNOSIS — R32 Unspecified urinary incontinence: Secondary | ICD-10-CM | POA: Diagnosis not present

## 2013-11-16 DIAGNOSIS — M1711 Unilateral primary osteoarthritis, right knee: Secondary | ICD-10-CM

## 2013-11-16 DIAGNOSIS — I10 Essential (primary) hypertension: Secondary | ICD-10-CM | POA: Diagnosis not present

## 2013-11-18 ENCOUNTER — Encounter: Payer: Self-pay | Admitting: Adult Health

## 2013-11-18 ENCOUNTER — Non-Acute Institutional Stay (SKILLED_NURSING_FACILITY): Payer: Medicare Other | Admitting: Adult Health

## 2013-11-18 ENCOUNTER — Other Ambulatory Visit: Payer: Self-pay | Admitting: *Deleted

## 2013-11-18 DIAGNOSIS — M1711 Unilateral primary osteoarthritis, right knee: Secondary | ICD-10-CM

## 2013-11-18 DIAGNOSIS — R350 Frequency of micturition: Secondary | ICD-10-CM | POA: Diagnosis not present

## 2013-11-18 DIAGNOSIS — I10 Essential (primary) hypertension: Secondary | ICD-10-CM | POA: Diagnosis not present

## 2013-11-18 DIAGNOSIS — R32 Unspecified urinary incontinence: Secondary | ICD-10-CM | POA: Insufficient documentation

## 2013-11-18 DIAGNOSIS — G35 Multiple sclerosis: Secondary | ICD-10-CM

## 2013-11-18 DIAGNOSIS — D62 Acute posthemorrhagic anemia: Secondary | ICD-10-CM | POA: Diagnosis not present

## 2013-11-18 MED ORDER — HYDROCODONE-ACETAMINOPHEN 5-325 MG PO TABS: 1.0000 | ORAL_TABLET | Freq: Four times a day (QID) | ORAL | Status: DC | PRN

## 2013-11-18 NOTE — Telephone Encounter (Signed)
[facility] 

## 2013-11-18 NOTE — Progress Notes (Signed)
HISTORY & PHYSICAL  DATE: 11/16/2013   FACILITY: [facility]  LEVEL OF CARE: SNF (31)  ALLERGIES:  Allergies  Allergen Reactions  . Latex Rash    Band-aids     CHIEF COMPLAINT:  Manage right knee OA, HTN & acute blood loss anemia  HISTORY OF PRESENT ILLNESS: pt is a 79 y/o Caucasian female:  KNEE OSTEOARTHRITIS: Patient had a history of pain and functional disability in the knee due to end-stage osteoarthritis and has failed nonsurgical conservative treatments. Patient had worsening of pain with activity and weight bearing, pain that interfered with activities of daily living & pain with passive range of motion. Therefore patient underwent total knee arthroplasty and tolerated the procedure well. Patient is admitted to this facility for sort short-term rehabilitation. Patient denies knee pain.  ANEMIA: The anemia has been stable. The patient denies fatigue, melena or hematochezia. No complications from the medications currently being used.  Post operatively pt suffered acute blood loss.  HTN: Pt 's HTN remains stable.  Denies CP, sob, DOE, pedal edema, headaches, dizziness or visual disturbances.  No complications from the medications currently being used.  Last BP : 162/77.  PAST MEDICAL HISTORY :  Past Medical History  Diagnosis Date  . MS (multiple sclerosis)   . Optic neuritis   . Vertigo   . Hypertension   . Memory loss     "from [place] & old age; I guess" (11/11/2013)  . Arthritis     "qwhere"  . Urinary urgency     PAST SURGICAL HISTORY: Past Surgical History  Procedure Laterality Date  . Knee arthroscopy Right 1994    "from staph infection"  . Cataract extraction w/ intraocular lens  implant, bilateral Bilateral 2013  . Joint replacement    . Total knee arthroplasty Right 11/10/2013  . Tonsillectomy  ~ 1958  . Vaginal hysterectomy  1973  . Knee arthroscopy  2012    "meniscus repair"  . Colectomy  1980's    S/P colonoscopy  .  Total knee arthroplasty Right 11/10/2013    Procedure: RIGHT TOTAL KNEE ARTHROPLASTY;  Surgeon: Ninetta Lights, MD;  Location: [facility];  Service: Orthopedics;  Laterality: Right;    SOCIAL HISTORY:  reports that she has never smoked. She has never used smokeless tobacco. She reports that she drinks about 4.2 ounces of alcohol per week. She reports that she does not use illicit drugs.  FAMILY HISTORY:  Family History  Problem Relation Age of Onset  . Cancer Father   . Leukemia Mother   . Heart disease    . Heart disease Maternal Grandmother     CURRENT MEDICATIONS: Reviewed per MAR/see medication list  REVIEW OF SYSTEMS:  See HPI otherwise 14 point ROS is negative.  PHYSICAL EXAMINATION  VS:  See VS section  GENERAL: no acute distress, normal body habitus EYES: conjunctivae normal, sclerae normal, normal eye lids MOUTH/THROAT: lips without lesions,no lesions in the mouth,tongue is without lesions,uvula elevates in midline NECK: supple, trachea midline, no neck masses, no thyroid tenderness, no thyromegaly LYMPHATICS: no LAN in the neck, no supraclavicular LAN RESPIRATORY: breathing is even & unlabored, BS CTAB CARDIAC: RRR, no murmur,no extra heart sounds, no edema GI:  ABDOMEN: abdomen soft, normal BS, no masses, no tenderness  LIVER/SPLEEN: no hepatomegaly, no splenomegaly MUSCULOSKELETAL: HEAD: normal to inspection  EXTREMITIES: LEFT UPPER EXTREMITY: full range of motion, normal strength & tone RIGHT UPPER EXTREMITY:  full range of motion,  normal strength & tone LEFT LOWER EXTREMITY:  full range of motion, normal strength & tone RIGHT LOWER EXTREMITY:  range of motion not tested due to surgery, normal strength & tone PSYCHIATRIC: the patient is alert & oriented to person, affect & behavior appropriate  LABS/RADIOLOGY:  Labs reviewed: Basic Metabolic Panel:  Recent Labs  11/02/13 1149 11/11/13 0815 11/12/13 0544  NA 138 142 140  K 4.5 4.4 4.5  CL 103 107 105    CO2 20 23 21   GLUCOSE 101* 100* 110*  BUN 23 15 16   CREATININE 0.61 0.65 0.63  CALCIUM 9.8 8.5 8.8   Liver Function Tests:  Recent Labs  11/02/13 1149  AST 28  ALT 19  ALKPHOS 73  BILITOT 1.0  PROT 7.3  ALBUMIN 4.1   CBC:  Recent Labs  11/02/13 1149 11/11/13 0815 11/12/13 0544  WBC 6.3 8.3 9.3  NEUTROABS 3.4  --   --   HGB 14.6 11.3* 11.3*  HCT 43.0 33.9* 34.5*  MCV 97.1 100.0 100.9*  PLT 278 247 251    CHEST  2 VIEW   COMPARISON:  None.   FINDINGS: Chronic interstitial markings/ emphysematous changes. No focal consolidation. No pleural effusion or pneumothorax.   The heart is normal in size.   Mild degenerative changes of the lower thoracolumbar spine.   IMPRESSION: No evidence of acute cardiopulmonary disease PORTABLE RIGHT KNEE - 1-2 VIEW   COMPARISON:  None.   FINDINGS: Status post right total knee arthroplasty. The femoral and tibial components appear to be well situated. Surgical drain is seen in the suprapatellar space.   IMPRESSION: Status post left total knee arthroplasty   ASSESSMENT/PLAN:  Right knee OA-s/p total knee arthroplasty.  Cont. Rehabilitation. Acute blood loss anemia-recheck Hb. HTN-last BP elevated.  Will monitor. Urinary incontinence-cont. Vesicare. Check cbc.  I have reviewed patient's medical records received at admission/from hospitalization.  CPT CODE: 21115  Senai Kingsley Y Jwan Hornbaker, [place] [phone]

## 2013-11-18 NOTE — Progress Notes (Signed)
Patient ID: Jennifer Atkins, female   DOB: [birth year], 79 y.o.   MRN: [id number]   11/18/2013  Facility[facility] Location:  [place] Room Number: 263-Z LEVEL OF CARE:  SNF (31)   Chief Complaint  Patient presents with  . Discharge Note    HISTORY OF PRESENT ILLNESS:  This is a 79 year old female who is for discharge home with Home health PT. She has been admitted to [facility] on 11/12/13 from [facility] with DJD S/P right total knee arthroplasty. Patient was admitted to this facility for short-term rehabilitation after the patient's recent hospitalization.  Patient has completed SNF rehabilitation and therapy has cleared the patient for discharge.  REASSESSMENT OF ONGOING PROBLEMS:  ANEMIA: The anemia has been stable. The patient denies fatigue, melena or hematochezia. No  medications currently being used. 10/15 hgb 11.3  HTN: Pt 's HTN remains stable.  Denies CP, sob, DOE, headaches, dizziness or visual disturbances.  No complications from the medications currently being used.  Last BP : 131/69  URINARY FREQUENCY: The urinary frequency remains stable.  Patient denies worsening frequency, leakage or nocturia.  No complications reported from the medication currently being used.  PAST MEDICAL HISTORY:  Past Medical History  Diagnosis Date  . MS (multiple sclerosis)   . Optic neuritis   . Vertigo   . Hypertension   . Memory loss     "from [place] & old age; I guess" (11/11/2013)  . Arthritis     "qwhere"  . Urinary urgency     CURRENT MEDICATIONS: Reviewed per MAR/see medication list  Allergies  Allergen Reactions  . Latex Rash    Band-aids      REVIEW OF SYSTEMS:  GENERAL: no change in appetite, no fatigue, no weight changes, no fever, chills or weakness RESPIRATORY: no cough, SOB, DOE, wheezing, hemoptysis CARDIAC: no chest pain, or palpitations, +edema GI: no abdominal pain, diarrhea, constipation, heart burn, nausea  or vomiting  PHYSICAL EXAMINATION  GENERAL: no acute distress, normal body habitus NECK: supple, trachea midline, no neck masses, no thyroid tenderness, no thyromegaly LYMPHATICS: no LAN in the neck, no supraclavicular LAN RESPIRATORY: breathing is even & unlabored, BS CTAB CARDIAC: RRR, no murmur,no extra heart sounds, RLE edema 1+ GI: abdomen soft, normal BS, no masses, no tenderness, no hepatomegaly, no splenomegaly EXTREMITIES: able to move all 4 extremities PSYCHIATRIC: the patient is alert & oriented to person, affect & behavior appropriate  LABS/RADIOLOGY: Labs reviewed: Basic Metabolic Panel:  Recent Labs  11/02/13 1149 11/11/13 0815 11/12/13 0544  NA 138 142 140  K 4.5 4.4 4.5  CL 103 107 105  CO2 20 23 21   GLUCOSE 101* 100* 110*  BUN 23 15 16   CREATININE 0.61 0.65 0.63  CALCIUM 9.8 8.5 8.8   Liver Function Tests:  Recent Labs  11/02/13 1149  AST 28  ALT 19  ALKPHOS 73  BILITOT 1.0  PROT 7.3  ALBUMIN 4.1   CBC:  Recent Labs  11/02/13 1149 11/11/13 0815 11/12/13 0544  WBC 6.3 8.3 9.3  NEUTROABS 3.4  --   --   HGB 14.6 11.3* 11.3*  HCT 43.0 33.9* 34.5*  MCV 97.1 100.0 100.9*  PLT 278 247 251    Dg Chest 2 View  11/02/2013   CLINICAL DATA:  Preop right total knee arthroplasty for osteoarthritis, hypertension  EXAM: CHEST  2 VIEW  COMPARISON:  None.  FINDINGS: Chronic interstitial markings/ emphysematous changes. No focal consolidation. No  pleural effusion or pneumothorax.  The heart is normal in size.  Mild degenerative changes of the lower thoracolumbar spine.  IMPRESSION: No evidence of acute cardiopulmonary disease.   Electronically Signed   By: Julian Hy M.D.   On: 11/02/2013 15:05   Dg Knee Right Port  11/10/2013   CLINICAL DATA:  Status post right total knee arthroplasty.  EXAM: PORTABLE RIGHT KNEE - 1-2 VIEW  COMPARISON:  None.  FINDINGS: Status post right total knee arthroplasty. The femoral and tibial components appear to be well  situated. Surgical drain is seen in the suprapatellar space.  IMPRESSION: Status post left total knee arthroplasty.   Electronically Signed   By: Sabino Dick M.D.   On: 11/10/2013 12:31    ASSESSMENT/PLAN:  DJD S/P right total knee arthroplasty - for Home health PT Multiple Sclerosis - stable Hypertension - well-controlled; continue Amlodipine and Diovan Urinary frequency - continue Vesicare Anemia, acute blood loss - stable   I have filled out patient's discharge paperwork and written prescriptions.  Patient will receive home health PT.  Total discharge time: Less than 30 minutes  Discharge time involved coordination of the discharge process with Education officer, museum, nursing staff and therapy department. Medical justification for home health services verified.  CPT CODE: 67703    MEDINA-VARGAS,MONINA, [facility] [phone]

## 2013-11-21 DIAGNOSIS — M159 Polyosteoarthritis, unspecified: Secondary | ICD-10-CM | POA: Diagnosis not present

## 2013-11-21 DIAGNOSIS — Z471 Aftercare following joint replacement surgery: Secondary | ICD-10-CM | POA: Diagnosis not present

## 2013-11-21 DIAGNOSIS — I10 Essential (primary) hypertension: Secondary | ICD-10-CM | POA: Diagnosis not present

## 2013-11-21 DIAGNOSIS — M1711 Unilateral primary osteoarthritis, right knee: Secondary | ICD-10-CM | POA: Diagnosis not present

## 2013-11-21 DIAGNOSIS — Z96651 Presence of right artificial knee joint: Secondary | ICD-10-CM | POA: Diagnosis not present

## 2013-11-21 DIAGNOSIS — G35 Multiple sclerosis: Secondary | ICD-10-CM | POA: Diagnosis not present

## 2013-11-22 DIAGNOSIS — Z96651 Presence of right artificial knee joint: Secondary | ICD-10-CM | POA: Diagnosis not present

## 2013-11-22 DIAGNOSIS — G35 Multiple sclerosis: Secondary | ICD-10-CM | POA: Diagnosis not present

## 2013-11-22 DIAGNOSIS — I10 Essential (primary) hypertension: Secondary | ICD-10-CM | POA: Diagnosis not present

## 2013-11-22 DIAGNOSIS — Z471 Aftercare following joint replacement surgery: Secondary | ICD-10-CM | POA: Diagnosis not present

## 2013-11-22 DIAGNOSIS — M159 Polyosteoarthritis, unspecified: Secondary | ICD-10-CM | POA: Diagnosis not present

## 2013-11-24 DIAGNOSIS — G35 Multiple sclerosis: Secondary | ICD-10-CM | POA: Diagnosis not present

## 2013-11-24 DIAGNOSIS — Z96651 Presence of right artificial knee joint: Secondary | ICD-10-CM | POA: Diagnosis not present

## 2013-11-24 DIAGNOSIS — M159 Polyosteoarthritis, unspecified: Secondary | ICD-10-CM | POA: Diagnosis not present

## 2013-11-24 DIAGNOSIS — I10 Essential (primary) hypertension: Secondary | ICD-10-CM | POA: Diagnosis not present

## 2013-11-24 DIAGNOSIS — Z471 Aftercare following joint replacement surgery: Secondary | ICD-10-CM | POA: Diagnosis not present

## 2013-11-26 DIAGNOSIS — G35 Multiple sclerosis: Secondary | ICD-10-CM | POA: Diagnosis not present

## 2013-11-26 DIAGNOSIS — I10 Essential (primary) hypertension: Secondary | ICD-10-CM | POA: Diagnosis not present

## 2013-11-26 DIAGNOSIS — Z471 Aftercare following joint replacement surgery: Secondary | ICD-10-CM | POA: Diagnosis not present

## 2013-11-26 DIAGNOSIS — M159 Polyosteoarthritis, unspecified: Secondary | ICD-10-CM | POA: Diagnosis not present

## 2013-11-26 DIAGNOSIS — Z96651 Presence of right artificial knee joint: Secondary | ICD-10-CM | POA: Diagnosis not present

## 2013-11-29 DIAGNOSIS — G35 Multiple sclerosis: Secondary | ICD-10-CM | POA: Diagnosis not present

## 2013-11-29 DIAGNOSIS — I10 Essential (primary) hypertension: Secondary | ICD-10-CM | POA: Diagnosis not present

## 2013-11-29 DIAGNOSIS — M159 Polyosteoarthritis, unspecified: Secondary | ICD-10-CM | POA: Diagnosis not present

## 2013-11-29 DIAGNOSIS — Z96651 Presence of right artificial knee joint: Secondary | ICD-10-CM | POA: Diagnosis not present

## 2013-11-29 DIAGNOSIS — Z471 Aftercare following joint replacement surgery: Secondary | ICD-10-CM | POA: Diagnosis not present

## 2013-12-01 DIAGNOSIS — G35 Multiple sclerosis: Secondary | ICD-10-CM | POA: Diagnosis not present

## 2013-12-01 DIAGNOSIS — I10 Essential (primary) hypertension: Secondary | ICD-10-CM | POA: Diagnosis not present

## 2013-12-01 DIAGNOSIS — Z96651 Presence of right artificial knee joint: Secondary | ICD-10-CM | POA: Diagnosis not present

## 2013-12-01 DIAGNOSIS — Z471 Aftercare following joint replacement surgery: Secondary | ICD-10-CM | POA: Diagnosis not present

## 2013-12-01 DIAGNOSIS — M159 Polyosteoarthritis, unspecified: Secondary | ICD-10-CM | POA: Diagnosis not present

## 2013-12-02 DIAGNOSIS — M159 Polyosteoarthritis, unspecified: Secondary | ICD-10-CM | POA: Diagnosis not present

## 2013-12-02 DIAGNOSIS — I10 Essential (primary) hypertension: Secondary | ICD-10-CM | POA: Diagnosis not present

## 2013-12-02 DIAGNOSIS — Z471 Aftercare following joint replacement surgery: Secondary | ICD-10-CM | POA: Diagnosis not present

## 2013-12-02 DIAGNOSIS — Z96651 Presence of right artificial knee joint: Secondary | ICD-10-CM | POA: Diagnosis not present

## 2013-12-02 DIAGNOSIS — G35 Multiple sclerosis: Secondary | ICD-10-CM | POA: Diagnosis not present

## 2013-12-06 DIAGNOSIS — Z471 Aftercare following joint replacement surgery: Secondary | ICD-10-CM | POA: Diagnosis not present

## 2013-12-06 DIAGNOSIS — R531 Weakness: Secondary | ICD-10-CM | POA: Diagnosis not present

## 2013-12-06 DIAGNOSIS — M1711 Unilateral primary osteoarthritis, right knee: Secondary | ICD-10-CM | POA: Diagnosis not present

## 2013-12-06 DIAGNOSIS — R262 Difficulty in walking, not elsewhere classified: Secondary | ICD-10-CM | POA: Diagnosis not present

## 2013-12-06 DIAGNOSIS — Z96651 Presence of right artificial knee joint: Secondary | ICD-10-CM | POA: Diagnosis not present

## 2013-12-08 DIAGNOSIS — R531 Weakness: Secondary | ICD-10-CM | POA: Diagnosis not present

## 2013-12-08 DIAGNOSIS — Z471 Aftercare following joint replacement surgery: Secondary | ICD-10-CM | POA: Diagnosis not present

## 2013-12-08 DIAGNOSIS — M1711 Unilateral primary osteoarthritis, right knee: Secondary | ICD-10-CM | POA: Diagnosis not present

## 2013-12-08 DIAGNOSIS — Z96651 Presence of right artificial knee joint: Secondary | ICD-10-CM | POA: Diagnosis not present

## 2013-12-08 DIAGNOSIS — R262 Difficulty in walking, not elsewhere classified: Secondary | ICD-10-CM | POA: Diagnosis not present

## 2013-12-13 DIAGNOSIS — Z471 Aftercare following joint replacement surgery: Secondary | ICD-10-CM | POA: Diagnosis not present

## 2013-12-13 DIAGNOSIS — R531 Weakness: Secondary | ICD-10-CM | POA: Diagnosis not present

## 2013-12-13 DIAGNOSIS — M1711 Unilateral primary osteoarthritis, right knee: Secondary | ICD-10-CM | POA: Diagnosis not present

## 2013-12-13 DIAGNOSIS — R262 Difficulty in walking, not elsewhere classified: Secondary | ICD-10-CM | POA: Diagnosis not present

## 2013-12-20 DIAGNOSIS — R531 Weakness: Secondary | ICD-10-CM | POA: Diagnosis not present

## 2013-12-20 DIAGNOSIS — M1711 Unilateral primary osteoarthritis, right knee: Secondary | ICD-10-CM | POA: Diagnosis not present

## 2013-12-20 DIAGNOSIS — Z471 Aftercare following joint replacement surgery: Secondary | ICD-10-CM | POA: Diagnosis not present

## 2013-12-20 DIAGNOSIS — R262 Difficulty in walking, not elsewhere classified: Secondary | ICD-10-CM | POA: Diagnosis not present

## 2013-12-21 DIAGNOSIS — Z96651 Presence of right artificial knee joint: Secondary | ICD-10-CM | POA: Diagnosis not present

## 2013-12-22 DIAGNOSIS — Z471 Aftercare following joint replacement surgery: Secondary | ICD-10-CM | POA: Diagnosis not present

## 2013-12-22 DIAGNOSIS — M1711 Unilateral primary osteoarthritis, right knee: Secondary | ICD-10-CM | POA: Diagnosis not present

## 2013-12-22 DIAGNOSIS — R262 Difficulty in walking, not elsewhere classified: Secondary | ICD-10-CM | POA: Diagnosis not present

## 2013-12-22 DIAGNOSIS — R531 Weakness: Secondary | ICD-10-CM | POA: Diagnosis not present

## 2013-12-28 DIAGNOSIS — R262 Difficulty in walking, not elsewhere classified: Secondary | ICD-10-CM | POA: Diagnosis not present

## 2013-12-28 DIAGNOSIS — Z471 Aftercare following joint replacement surgery: Secondary | ICD-10-CM | POA: Diagnosis not present

## 2013-12-28 DIAGNOSIS — M1711 Unilateral primary osteoarthritis, right knee: Secondary | ICD-10-CM | POA: Diagnosis not present

## 2013-12-28 DIAGNOSIS — R531 Weakness: Secondary | ICD-10-CM | POA: Diagnosis not present

## 2013-12-30 DIAGNOSIS — Z471 Aftercare following joint replacement surgery: Secondary | ICD-10-CM | POA: Diagnosis not present

## 2013-12-30 DIAGNOSIS — M1711 Unilateral primary osteoarthritis, right knee: Secondary | ICD-10-CM | POA: Diagnosis not present

## 2013-12-30 DIAGNOSIS — R262 Difficulty in walking, not elsewhere classified: Secondary | ICD-10-CM | POA: Diagnosis not present

## 2013-12-30 DIAGNOSIS — R531 Weakness: Secondary | ICD-10-CM | POA: Diagnosis not present

## 2014-01-03 DIAGNOSIS — R262 Difficulty in walking, not elsewhere classified: Secondary | ICD-10-CM | POA: Diagnosis not present

## 2014-01-03 DIAGNOSIS — R531 Weakness: Secondary | ICD-10-CM | POA: Diagnosis not present

## 2014-01-03 DIAGNOSIS — M1711 Unilateral primary osteoarthritis, right knee: Secondary | ICD-10-CM | POA: Diagnosis not present

## 2014-01-03 DIAGNOSIS — Z471 Aftercare following joint replacement surgery: Secondary | ICD-10-CM | POA: Diagnosis not present

## 2014-01-06 DIAGNOSIS — M1711 Unilateral primary osteoarthritis, right knee: Secondary | ICD-10-CM | POA: Diagnosis not present

## 2014-01-06 DIAGNOSIS — Z471 Aftercare following joint replacement surgery: Secondary | ICD-10-CM | POA: Diagnosis not present

## 2014-01-06 DIAGNOSIS — R262 Difficulty in walking, not elsewhere classified: Secondary | ICD-10-CM | POA: Diagnosis not present

## 2014-01-06 DIAGNOSIS — R531 Weakness: Secondary | ICD-10-CM | POA: Diagnosis not present

## 2014-02-01 DIAGNOSIS — Z96651 Presence of right artificial knee joint: Secondary | ICD-10-CM | POA: Diagnosis not present

## 2014-02-01 DIAGNOSIS — M25551 Pain in right hip: Secondary | ICD-10-CM | POA: Diagnosis not present

## 2014-02-16 DIAGNOSIS — D1801 Hemangioma of skin and subcutaneous tissue: Secondary | ICD-10-CM | POA: Diagnosis not present

## 2014-02-16 DIAGNOSIS — L814 Other melanin hyperpigmentation: Secondary | ICD-10-CM | POA: Diagnosis not present

## 2014-02-16 DIAGNOSIS — L820 Inflamed seborrheic keratosis: Secondary | ICD-10-CM | POA: Diagnosis not present

## 2014-02-16 DIAGNOSIS — L821 Other seborrheic keratosis: Secondary | ICD-10-CM | POA: Diagnosis not present

## 2014-02-16 DIAGNOSIS — D225 Melanocytic nevi of trunk: Secondary | ICD-10-CM | POA: Diagnosis not present

## 2014-02-18 DIAGNOSIS — M25551 Pain in right hip: Secondary | ICD-10-CM | POA: Diagnosis not present

## 2014-02-18 DIAGNOSIS — S8391XA Sprain of unspecified site of right knee, initial encounter: Secondary | ICD-10-CM | POA: Diagnosis not present

## 2014-02-18 DIAGNOSIS — S335XXA Sprain of ligaments of lumbar spine, initial encounter: Secondary | ICD-10-CM | POA: Diagnosis not present

## 2014-03-28 DIAGNOSIS — H04123 Dry eye syndrome of bilateral lacrimal glands: Secondary | ICD-10-CM | POA: Diagnosis not present

## 2014-03-28 DIAGNOSIS — H18411 Arcus senilis, right eye: Secondary | ICD-10-CM | POA: Diagnosis not present

## 2014-03-28 DIAGNOSIS — Z961 Presence of intraocular lens: Secondary | ICD-10-CM | POA: Diagnosis not present

## 2014-03-28 DIAGNOSIS — H18412 Arcus senilis, left eye: Secondary | ICD-10-CM | POA: Diagnosis not present

## 2014-04-28 DIAGNOSIS — Z7982 Long term (current) use of aspirin: Secondary | ICD-10-CM | POA: Diagnosis not present

## 2014-04-28 DIAGNOSIS — Z0000 Encounter for general adult medical examination without abnormal findings: Secondary | ICD-10-CM | POA: Diagnosis not present

## 2014-04-28 DIAGNOSIS — I10 Essential (primary) hypertension: Secondary | ICD-10-CM | POA: Diagnosis not present

## 2014-04-28 DIAGNOSIS — E780 Pure hypercholesterolemia: Secondary | ICD-10-CM | POA: Diagnosis not present

## 2014-05-02 DIAGNOSIS — I10 Essential (primary) hypertension: Secondary | ICD-10-CM | POA: Diagnosis not present

## 2014-05-02 DIAGNOSIS — R350 Frequency of micturition: Secondary | ICD-10-CM | POA: Diagnosis not present

## 2014-05-06 DIAGNOSIS — M25561 Pain in right knee: Secondary | ICD-10-CM | POA: Diagnosis not present

## 2014-05-06 DIAGNOSIS — S335XXD Sprain of ligaments of lumbar spine, subsequent encounter: Secondary | ICD-10-CM | POA: Diagnosis not present

## 2014-05-30 DIAGNOSIS — I10 Essential (primary) hypertension: Secondary | ICD-10-CM | POA: Diagnosis not present

## 2014-05-30 DIAGNOSIS — R350 Frequency of micturition: Secondary | ICD-10-CM | POA: Diagnosis not present

## 2014-07-08 ENCOUNTER — Ambulatory Visit: Payer: Medicare Other | Admitting: Neurology

## 2014-07-11 ENCOUNTER — Encounter: Payer: Self-pay | Admitting: Neurology

## 2014-07-11 ENCOUNTER — Ambulatory Visit (INDEPENDENT_AMBULATORY_CARE_PROVIDER_SITE_OTHER): Payer: Medicare Other | Admitting: Neurology

## 2014-07-11 VITALS — BP 148/79 | HR 67 | Ht 65.0 in | Wt 150.0 lb

## 2014-07-11 DIAGNOSIS — R269 Unspecified abnormalities of gait and mobility: Secondary | ICD-10-CM

## 2014-07-11 DIAGNOSIS — G35 Multiple sclerosis: Secondary | ICD-10-CM | POA: Diagnosis not present

## 2014-07-11 DIAGNOSIS — R200 Anesthesia of skin: Secondary | ICD-10-CM

## 2014-07-11 DIAGNOSIS — R208 Other disturbances of skin sensation: Secondary | ICD-10-CM | POA: Diagnosis not present

## 2014-07-11 NOTE — Progress Notes (Addendum)
Chief Complaint  Patient presents with  . Multiple Sclerosis    Reports her numbness is worse in bilateral feet.  She has started to drool and has difficulty when drinking.  . Memory Loss    MMSE 28/30 - 11 animals.  Feels her memory has worsened since last year.      PATIENT: Jennifer Atkins DOB: Jun 03, 1934  HISTORICAL  Jennifer Atkins is a 80 year old right-handed Caucasian female, followup for multiple sclerosis, her primary care physician is [facility] Dr. Shelia Media, last clinical visit was with Hoyle Sauer in May 2014  She had past medical history of relapsing remitting multiple sclerosis, was patient of Dr. Erling Cruz for many years, she had right optic neuritis with symptoms beginning in 1980, with total recovery within one month after po steroid treatment, recurrent right optic neuritis again a year later.  Initially, she has some up and down episodes, but she could not recall details.  Over the years, she has developed gradual onset gait difficulty, especially since 2014, also fatigue, mild memory loss,   She has never been on any immunomodulating medications.   She was highly functional, driving, water aerobic, golf regularly, she has urinary urgency, nocturia, taking vesicare, occasionally bladder incontinence when she gets up from a seated position  She fell in Jan 2015, went to ED, had left forehead laceration, previously, she fell in August 2014 at [facility].    She was referred to physical therapy afterwards, machine exercise has made her right leg worse, right foot pain.  She now complains of worsening right foot numbness, right knee pain, has right meniscus repair in the past, right leg numbness, weakness, she also has mild left foot numbness.   UPDATE July 01 2013:  She fell again in June 10th 2015, without warning signs, now with right thumb pain, now she has right foot numbness, multiple falling episodes over past one year. We have reviewed MRI of the brain,  mild atrophy, scattered periventricular white matter disease, small vessel disease vs. MS lesions,  MRI thoracic spine,Subtle T2 hyperintensity at T6-7 level, may represent chronic demyelinating plaque. No acute plaques are seen.  MRI lumbar spine (without) demonstrating multilevel degenerative disc disease, most severe at L5-S1, disc bulging and facet hypertrophy with moderate right and mild left foraminal stenosis; potential impingement upon the right L5 and descending bilateral S1 roots. L3-4: disc bulging and facet hypertrophy with mild right and moderate left foraminal stenosis.  L4-5: disc bulging and facet hypertrophy with moderate right and mild left foraminal stenosis   MRI cervical:C4-5: disc bulging with mild biforaminal stenosis. C5-6. C6-7: disc bulging and uncovertebral joint hypertrophy with mild biforaminal stenosis. No intrinsic or compressive spinal cord lesions. No abnormal enhancing lesions.  UPDATE July 11 2014: She continues to do very well, exercise regularly, never was treated with immunomodulation therapy, she had right knee replacement October 2015, recovered very well. She complains of worsening bowel and bladder urgency,occasionally incontinence,  She complains of lack of stamina,  ROS: Fatigue, incontinence of bladder, drooling, incontinence of bowel and bladder, frequent urination, bruise easily, memory loss, numbness  HOME MEDICATIONS: Current Outpatient Prescriptions on File Prior to Visit  Medication Sig Dispense Refill  . amLODipine (NORVASC) 2.5 MG tablet Take 2.5 mg by mouth daily.    . Multiple Vitamins-Minerals (MULTIVITAMIN PO) Take 1 tablet by mouth daily.     . RESTASIS 0.05 % ophthalmic emulsion Place 1 drop into both eyes 2 (two) times daily.     . valsartan (DIOVAN)  320 MG tablet Take 320 mg by mouth daily.    Marland Kitchen VITAMIN D, CHOLECALCIFEROL, PO Take 1 tablet by mouth 2 (two) times daily.      No current facility-administered medications on file prior  to visit.    PAST MEDICAL HISTORY: Past Medical History  Diagnosis Date  . MS (multiple sclerosis)   . Optic neuritis   . Vertigo   . Hypertension   . Memory loss     "from [place] & old age; I guess" (11/11/2013)  . Arthritis     "qwhere"  . Urinary urgency     PAST SURGICAL HISTORY: Past Surgical History  Procedure Laterality Date  . Knee arthroscopy Right 1994    "from staph infection"  . Cataract extraction w/ intraocular lens  implant, bilateral Bilateral 2013  . Joint replacement    . Total knee arthroplasty Right 11/10/2013  . Tonsillectomy  ~ 1958  . Vaginal hysterectomy  1973  . Knee arthroscopy  2012    "meniscus repair"  . Colectomy  1980's    S/P colonoscopy  . Total knee arthroplasty Right 11/10/2013    Procedure: RIGHT TOTAL KNEE ARTHROPLASTY;  Surgeon: Ninetta Lights, MD;  Location: [facility];  Service: Orthopedics;  Laterality: Right;    FAMILY HISTORY: Family History  Problem Relation Age of Onset  . Cancer Father   . Leukemia Mother   . Heart disease    . Heart disease Maternal Grandmother     SOCIAL HISTORY:  History   Social History  . Marital Status: Widowed    Spouse Name: N/A  . Number of Children: 6  . Years of Education: College   Occupational History  .      Retired   Social History Main Topics  . Smoking status: Never Smoker   . Smokeless tobacco: Never Used  . Alcohol Use: 4.2 oz/week    7 Glasses of wine per week  . Drug Use: No  . Sexual Activity: No   Other Topics Concern  . Not on file   Social History Narrative   Patient is retired and has a Gaffer. She is a widow and has 6 children. She lives at the [place] [facility].      PHYSICAL EXAM   Filed Vitals:   07/11/14 1318  BP: 148/79  Pulse: 67  Height: 5\' 5"  (1.651 m)  Weight: 150 lb (68.04 kg)    Not recorded      Body mass index is 24.96 kg/(m^2).   Generalized: In no acute distress  Neck: Supple, no carotid bruits   Cardiac: Regular  rate rhythm  Pulmonary: Clear to auscultation bilaterally  Musculoskeletal: No deformity  Neurological examination  Mentation: Mini-Mental Status Examination is 28 out of 30, she missed 2 out of 3 recalls,animal naming was 11  Cranial nerve II-XII: Pupils were equal round reactive to light. Extraocular movements were full.  Visual field were full on confrontational test. Bilateral fundi were sharp.  Facial sensation and strength were normal. Hearing was intact to finger rubbing bilaterally. Uvula tongue midline.  Head turning and shoulder shrug and were normal and symmetric.Tongue protrusion into cheek strength was normal.  Motor: Right thumb swelling tenderness, mild bilateral lower extremity spasticity  Sensory: Intact to fine touch, pinprick, preserved vibratory sensation, and proprioception at toes.  Coordination: Normal finger to nose, heel-to-shin bilaterally there was no truncal ataxia  Gait: Rising up from seated position without assistance,  Bilateral valgrus knee, cautious, mildly unsteady Romberg  signs: Negative  Deep tendon reflexes: Brachioradialis 3/3, biceps 3/3, triceps 3/3, patellar 3/3, Achilles 2/2, nonsustained ankle clonus, plantar responses were extensor bilaterally.   DIAGNOSTIC DATA (LABS, IMAGING, TESTING) - I reviewed patient records, labs, notes, testing and imaging myself where available.    ASSESSMENT AND PLAN  KIYONNA TORTORELLI is a 80 y.o. female carries the diagnosis of multiple sclerosis, never received immunomodulation therapy, had slow progressive worsening gait difficulty,urinary frequency, nocturia, over the years. She had hyperreflexia on exam, nonsustained ankle clonus, mild length dependent sensory changes, we have reviewed MRI together, detailed above. There was no active lesions, no enhancing lesions to suggestive of active multiple sclerosis process,no significant canal or foraminal stenosis to warrant surgery.  She is continue moderate  exercise, gait training, return to clinic in one year with Rhae Hammock, M.D. Ph.D.  [facility] [address], [place],  [address] [phone]

## 2014-09-05 ENCOUNTER — Other Ambulatory Visit (HOSPITAL_COMMUNITY): Payer: Self-pay | Admitting: Internal Medicine

## 2014-09-05 DIAGNOSIS — Z1231 Encounter for screening mammogram for malignant neoplasm of breast: Secondary | ICD-10-CM

## 2014-10-05 ENCOUNTER — Ambulatory Visit (HOSPITAL_COMMUNITY): Payer: Medicare Other

## 2014-10-07 ENCOUNTER — Ambulatory Visit (HOSPITAL_COMMUNITY)
Admission: RE | Admit: 2014-10-07 | Discharge: 2014-10-07 | Disposition: A | Discharge status: Home / Self Care | Payer: Medicare Other | Source: Ambulatory Visit | Attending: Internal Medicine | Admitting: Internal Medicine

## 2014-10-07 DIAGNOSIS — Z1231 Encounter for screening mammogram for malignant neoplasm of breast: Secondary | ICD-10-CM | POA: Insufficient documentation

## 2014-11-04 DIAGNOSIS — Z23 Encounter for immunization: Secondary | ICD-10-CM | POA: Diagnosis not present

## 2014-11-11 DIAGNOSIS — M25562 Pain in left knee: Secondary | ICD-10-CM | POA: Diagnosis not present

## 2014-11-11 DIAGNOSIS — M25561 Pain in right knee: Secondary | ICD-10-CM | POA: Diagnosis not present

## 2014-11-11 DIAGNOSIS — M25552 Pain in left hip: Secondary | ICD-10-CM | POA: Diagnosis not present

## 2015-03-13 DIAGNOSIS — D1801 Hemangioma of skin and subcutaneous tissue: Secondary | ICD-10-CM | POA: Diagnosis not present

## 2015-03-13 DIAGNOSIS — L812 Freckles: Secondary | ICD-10-CM | POA: Diagnosis not present

## 2015-03-13 DIAGNOSIS — L820 Inflamed seborrheic keratosis: Secondary | ICD-10-CM | POA: Diagnosis not present

## 2015-04-05 DIAGNOSIS — H18412 Arcus senilis, left eye: Secondary | ICD-10-CM | POA: Diagnosis not present

## 2015-04-05 DIAGNOSIS — H04123 Dry eye syndrome of bilateral lacrimal glands: Secondary | ICD-10-CM | POA: Diagnosis not present

## 2015-04-05 DIAGNOSIS — Z961 Presence of intraocular lens: Secondary | ICD-10-CM | POA: Diagnosis not present

## 2015-04-05 DIAGNOSIS — H18411 Arcus senilis, right eye: Secondary | ICD-10-CM | POA: Diagnosis not present

## 2015-04-26 DIAGNOSIS — Z7982 Long term (current) use of aspirin: Secondary | ICD-10-CM | POA: Diagnosis not present

## 2015-04-26 DIAGNOSIS — E7800 Pure hypercholesterolemia, unspecified: Secondary | ICD-10-CM | POA: Diagnosis not present

## 2015-04-26 DIAGNOSIS — Z0000 Encounter for general adult medical examination without abnormal findings: Secondary | ICD-10-CM | POA: Diagnosis not present

## 2015-05-03 DIAGNOSIS — I10 Essential (primary) hypertension: Secondary | ICD-10-CM | POA: Diagnosis not present

## 2015-05-03 DIAGNOSIS — Z7982 Long term (current) use of aspirin: Secondary | ICD-10-CM | POA: Diagnosis not present

## 2015-05-03 DIAGNOSIS — Z1212 Encounter for screening for malignant neoplasm of rectum: Secondary | ICD-10-CM | POA: Diagnosis not present

## 2015-05-03 DIAGNOSIS — M159 Polyosteoarthritis, unspecified: Secondary | ICD-10-CM | POA: Diagnosis not present

## 2015-05-03 DIAGNOSIS — E7800 Pure hypercholesterolemia, unspecified: Secondary | ICD-10-CM | POA: Diagnosis not present

## 2015-05-03 DIAGNOSIS — R5383 Other fatigue: Secondary | ICD-10-CM | POA: Diagnosis not present

## 2015-05-10 DIAGNOSIS — M810 Age-related osteoporosis without current pathological fracture: Secondary | ICD-10-CM | POA: Diagnosis not present

## 2015-07-10 ENCOUNTER — Ambulatory Visit (INDEPENDENT_AMBULATORY_CARE_PROVIDER_SITE_OTHER): Payer: Medicare Other | Admitting: Neurology

## 2015-07-10 ENCOUNTER — Encounter: Payer: Self-pay | Admitting: Neurology

## 2015-07-10 VITALS — BP 132/68 | HR 72 | Resp 14 | Ht 65.0 in | Wt 153.0 lb

## 2015-07-10 DIAGNOSIS — G35 Multiple sclerosis: Secondary | ICD-10-CM

## 2015-07-10 DIAGNOSIS — R208 Other disturbances of skin sensation: Secondary | ICD-10-CM

## 2015-07-10 DIAGNOSIS — R200 Anesthesia of skin: Secondary | ICD-10-CM

## 2015-07-10 NOTE — Progress Notes (Signed)
Chief Complaint  Patient presents with  . Multiple Sclerosis    She remains off of any MS therapy.  Denes new or worsening sx. but sts. nocturia and gait disturbance continue to be problematic/fim   Chief Complaint  Patient presents with  . Multiple Sclerosis    She remains off of any MS therapy.  Denes new or worsening sx. but sts. nocturia and gait disturbance continue to be problematic/fim      PATIENT: Jennifer Atkins DOB: March 13, 1934  HISTORICAL  Jennifer Atkins is a 81 year old right-handed Caucasian female, followup for multiple sclerosis, her primary care physician is [facility] Dr. Shelia Media, last clinical visit was with Hoyle Sauer in May 2014  She had past medical history of relapsing remitting multiple sclerosis, was patient of Dr. Erling Cruz for many years, she had right optic neuritis with symptoms beginning in 1980, with total recovery within one month after po steroid treatment, recurrent right optic neuritis again a year later.  Initially, she has some up and down episodes, but she could not recall details.  Over the years, she has developed gradual onset gait difficulty, especially since 2014, also fatigue, mild memory loss,   She has never been on any immunomodulating medications.   She was highly functional, driving, water aerobic, golf regularly, she has urinary urgency, nocturia, taking vesicare, occasionally bladder incontinence when she gets up from a seated position  She fell in Jan 2015, went to ED, had left forehead laceration, previously, she fell in August 2014 at [facility].    She was referred to physical therapy afterwards, machine exercise has made her right leg worse, right foot pain.  She now complains of worsening right foot numbness, right knee pain, has right meniscus repair in the past, right leg numbness, weakness, she also has mild left foot numbness.   UPDATE July 01 2013:  She fell again in June 10th 2015, without warning signs, now with  right thumb pain, now she has right foot numbness, multiple falling episodes over past one year. We have reviewed MRI of the brain, mild atrophy, scattered periventricular white matter disease, small vessel disease vs. MS lesions,  MRI thoracic spine,Subtle T2 hyperintensity at T6-7 level, may represent chronic demyelinating plaque. No acute plaques are seen.  MRI lumbar spine (without) demonstrating multilevel degenerative disc disease, most severe at L5-S1, disc bulging and facet hypertrophy with moderate right and mild left foraminal stenosis; potential impingement upon the right L5 and descending bilateral S1 roots. L3-4: disc bulging and facet hypertrophy with mild right and moderate left foraminal stenosis.  L4-5: disc bulging and facet hypertrophy with moderate right and mild left foraminal stenosis   MRI cervical:C4-5: disc bulging with mild biforaminal stenosis. C5-6. C6-7: disc bulging and uncovertebral joint hypertrophy with mild biforaminal stenosis. No intrinsic or compressive spinal cord lesions. No abnormal enhancing lesions.  UPDATE July 11 2014: She continues to do very well, exercise regularly, never was treated with immunomodulation therapy, she had right knee replacement October 2015, recovered very well. She complains of worsening bowel and bladder urgency,occasionally incontinence,  She complains of lack of stamina,  UPDATE June 19th 2017: She still has right knee pain despite previous right knee replacement,, also left knee pain, she still exercise regularly, water aerobic, which has been helpful, she does not drive on interstate, she has more right foot numbness, she does not feel confidence driving on the high way.  She still plays golf, she has six children, she will visit her son, she still  lives in her house.  ROS: Fatigue, incontinence of bladder, drooling, incontinence of bowel and bladder, frequent urination, bruise easily, memory loss, numbness  HOME  MEDICATIONS: Current Outpatient Prescriptions on File Prior to Visit  Medication Sig Dispense Refill  . Acetaminophen (TYLENOL ARTHRITIS PAIN PO) Take by mouth daily.    Marland Kitchen amLODipine (NORVASC) 2.5 MG tablet Take 2.5 mg by mouth daily.    Marland Kitchen aspirin 81 MG tablet Take 81 mg by mouth daily.    . calcium carbonate (TUMS - DOSED IN MG ELEMENTAL CALCIUM) 500 MG chewable tablet Chew 2 tablets by mouth 2 (two) times daily.    . Glucosamine-Chondroitin (GLUCOSAMINE CHONDR COMPLEX PO) Take by mouth 2 (two) times daily.    . Mirabegron (MYRBETRIQ PO) Take by mouth daily.    . Multiple Vitamins-Minerals (MULTIVITAMIN PO) Take 1 tablet by mouth daily.     . Omega-3 Fatty Acids (FISH OIL BURP-LESS PO) Take by mouth 3 (three) times daily.    . RESTASIS 0.05 % ophthalmic emulsion Place 1 drop into both eyes 2 (two) times daily.     . valsartan (DIOVAN) 320 MG tablet Take 320 mg by mouth daily.    Marland Kitchen VITAMIN D, CHOLECALCIFEROL, PO Take 1 tablet by mouth 2 (two) times daily.      No current facility-administered medications on file prior to visit.    PAST MEDICAL HISTORY: Past Medical History  Diagnosis Date  . MS (multiple sclerosis) ([place])   . Optic neuritis   . Vertigo   . Hypertension   . Memory loss     "from [place] & old age; I guess" (11/11/2013)  . Arthritis     "qwhere"  . Urinary urgency     PAST SURGICAL HISTORY: Past Surgical History  Procedure Laterality Date  . Knee arthroscopy Right 1994    "from staph infection"  . Cataract extraction w/ intraocular lens  implant, bilateral Bilateral 2013  . Joint replacement    . Total knee arthroplasty Right 11/10/2013  . Tonsillectomy  ~ 1958  . Vaginal hysterectomy  1973  . Knee arthroscopy  2012    "meniscus repair"  . Colectomy  1980's    S/P colonoscopy  . Total knee arthroplasty Right 11/10/2013    Procedure: RIGHT TOTAL KNEE ARTHROPLASTY;  Surgeon: Ninetta Lights, MD;  Location: [facility];  Service: Orthopedics;  Laterality: Right;     FAMILY HISTORY: Family History  Problem Relation Age of Onset  . Cancer Father   . Leukemia Mother   . Heart disease    . Heart disease Maternal Grandmother     SOCIAL HISTORY:  Social History   Social History  . Marital Status: Widowed    Spouse Name: N/A  . Number of Children: 6  . Years of Education: College   Occupational History  .      Retired   Social History Main Topics  . Smoking status: Never Smoker   . Smokeless tobacco: Never Used  . Alcohol Use: 4.2 oz/week    7 Glasses of wine per week  . Drug Use: No  . Sexual Activity: No   Other Topics Concern  . Not on file   Social History Narrative   Patient is retired and has a Gaffer. She is a widow and has 6 children. She lives at the [place] at [facility].      PHYSICAL EXAM   Filed Vitals:   07/10/15 1321  BP: 132/68  Pulse: 72  Resp: 14  Height: 5\' 5"  (1.651 m)  Weight: 153 lb (69.4 kg)    Not recorded      Body mass index is 25.46 kg/(m^2).   Generalized: In no acute distress  Neck: Supple, no carotid bruits   Cardiac: Regular rate rhythm  Pulmonary: Clear to auscultation bilaterally  Musculoskeletal: No deformity  Neurological examination  Mentation: Mini-Mental Status Examination is 28 out of 30, she missed 2 out of 3 recalls,animal naming was 11  Cranial nerve II-XII: Pupils were equal round reactive to light. Extraocular movements were full.  Visual field were full on confrontational test. Bilateral fundi were sharp.  Facial sensation and strength were normal. Hearing was intact to finger rubbing bilaterally. Uvula tongue midline.  Head turning and shoulder shrug and were normal and symmetric.Tongue protrusion into cheek strength was normal.  Motor: Right thumb swelling tenderness, mild bilateral lower extremity spasticity  Sensory: Intact to fine touch, pinprick, preserved vibratory sensation, and proprioception at toes.  Coordination: Normal finger to nose,  heel-to-shin bilaterally there was no truncal ataxia  Gait: Rising up from seated position without assistance,  Bilateral valgrus knee, cautious, mildly unsteady Romberg signs: Negative  Deep tendon reflexes: Brachioradialis 3/3, biceps 3/3, triceps 3/3, patellar 3/3, Achilles 2/2, nonsustained ankle clonus, plantar responses were extensor bilaterally.   DIAGNOSTIC DATA (LABS, IMAGING, TESTING) - I reviewed patient records, labs, notes, testing and imaging myself where available.    ASSESSMENT AND PLAN  Jennifer Atkins is a 81 y.o. female carries the diagnosis of multiple sclerosis, never received immunomodulation therapy, had slow progressive worsening gait difficulty,urinary frequency, nocturia, over the years. She had hyperreflexia on exam, nonsustained ankle clonus, mild length dependent sensory changes, most recent repeat MRI was in 2015 sThere was no active lesions, no enhancing lesions to suggestive of active multiple sclerosis process  She is continue moderate exercise, gait training, return to clinic in one year    Marcial Pacas, M.D. Ph.D.  [facility] [address], [place], [place] [address] [phone]

## 2015-08-09 DIAGNOSIS — M8580 Other specified disorders of bone density and structure, unspecified site: Secondary | ICD-10-CM | POA: Diagnosis not present

## 2015-08-09 DIAGNOSIS — E559 Vitamin D deficiency, unspecified: Secondary | ICD-10-CM | POA: Diagnosis not present

## 2015-08-22 DIAGNOSIS — M8580 Other specified disorders of bone density and structure, unspecified site: Secondary | ICD-10-CM | POA: Diagnosis not present

## 2015-08-22 DIAGNOSIS — M79641 Pain in right hand: Secondary | ICD-10-CM | POA: Diagnosis not present

## 2015-08-22 DIAGNOSIS — M79642 Pain in left hand: Secondary | ICD-10-CM | POA: Diagnosis not present

## 2015-08-22 DIAGNOSIS — M79643 Pain in unspecified hand: Secondary | ICD-10-CM | POA: Diagnosis not present

## 2015-08-31 ENCOUNTER — Other Ambulatory Visit: Payer: Self-pay | Admitting: Internal Medicine

## 2015-08-31 DIAGNOSIS — Z1231 Encounter for screening mammogram for malignant neoplasm of breast: Secondary | ICD-10-CM

## 2015-09-06 DIAGNOSIS — M1990 Unspecified osteoarthritis, unspecified site: Secondary | ICD-10-CM | POA: Diagnosis not present

## 2015-09-06 DIAGNOSIS — M8580 Other specified disorders of bone density and structure, unspecified site: Secondary | ICD-10-CM | POA: Diagnosis not present

## 2015-09-06 DIAGNOSIS — M79643 Pain in unspecified hand: Secondary | ICD-10-CM | POA: Diagnosis not present

## 2015-09-15 ENCOUNTER — Other Ambulatory Visit: Payer: Self-pay

## 2015-10-06 DIAGNOSIS — Z23 Encounter for immunization: Secondary | ICD-10-CM | POA: Diagnosis not present

## 2015-10-09 ENCOUNTER — Ambulatory Visit
Admission: RE | Admit: 2015-10-09 | Discharge: 2015-10-09 | Disposition: A | Discharge status: Home / Self Care | Payer: Medicare Other | Source: Ambulatory Visit | Attending: Internal Medicine | Admitting: Internal Medicine

## 2015-10-09 DIAGNOSIS — Z1231 Encounter for screening mammogram for malignant neoplasm of breast: Secondary | ICD-10-CM

## 2015-11-14 DIAGNOSIS — M25562 Pain in left knee: Secondary | ICD-10-CM | POA: Diagnosis not present

## 2015-11-14 DIAGNOSIS — M25561 Pain in right knee: Secondary | ICD-10-CM | POA: Diagnosis not present

## 2015-11-14 DIAGNOSIS — M25551 Pain in right hip: Secondary | ICD-10-CM | POA: Diagnosis not present

## 2015-11-17 IMAGING — CR DG HIP W/ PELVIS BILAT
5 series · 5 of 5 positions shown · non-contrast
Comparison: None.

CLINICAL DATA: Fall with bilateral hip pain.

EXAM:
BILATERAL HIP WITH PELVIS - 4+ VIEW

[t pelvis a.p.]
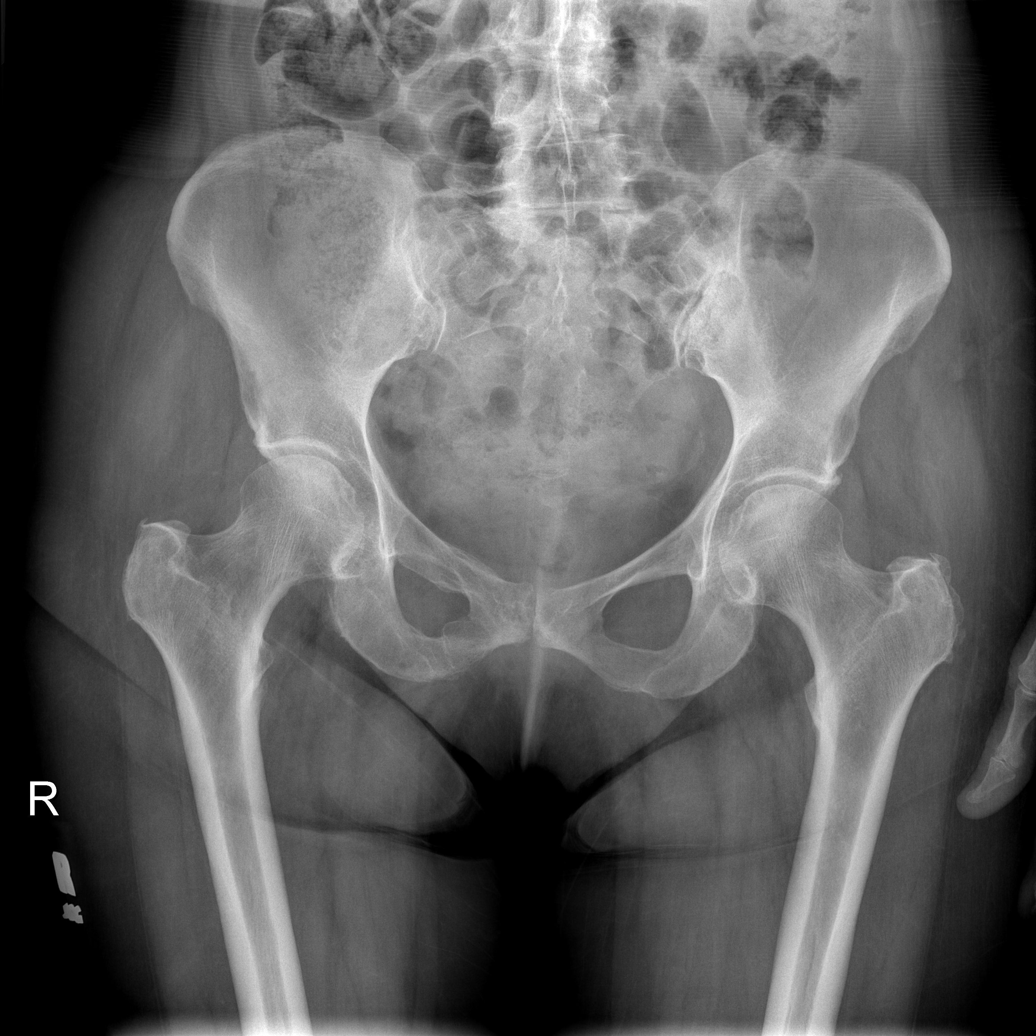

[t hip ap left]
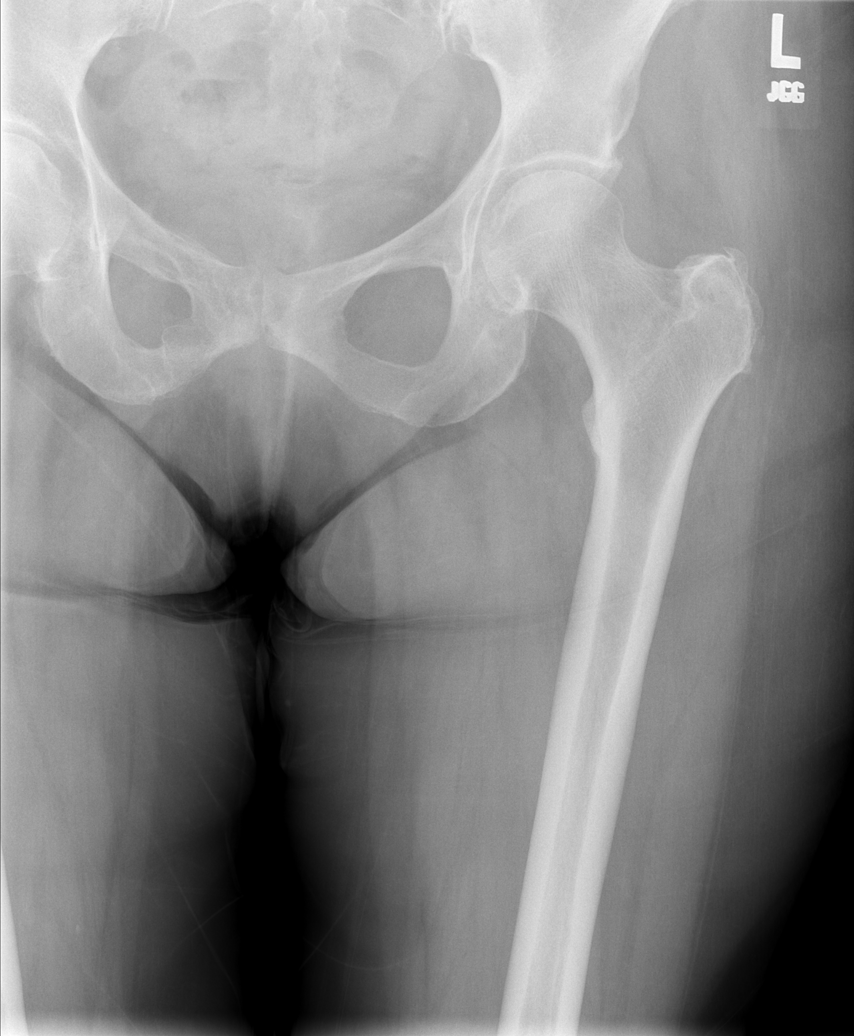

[t hip frog leg left]
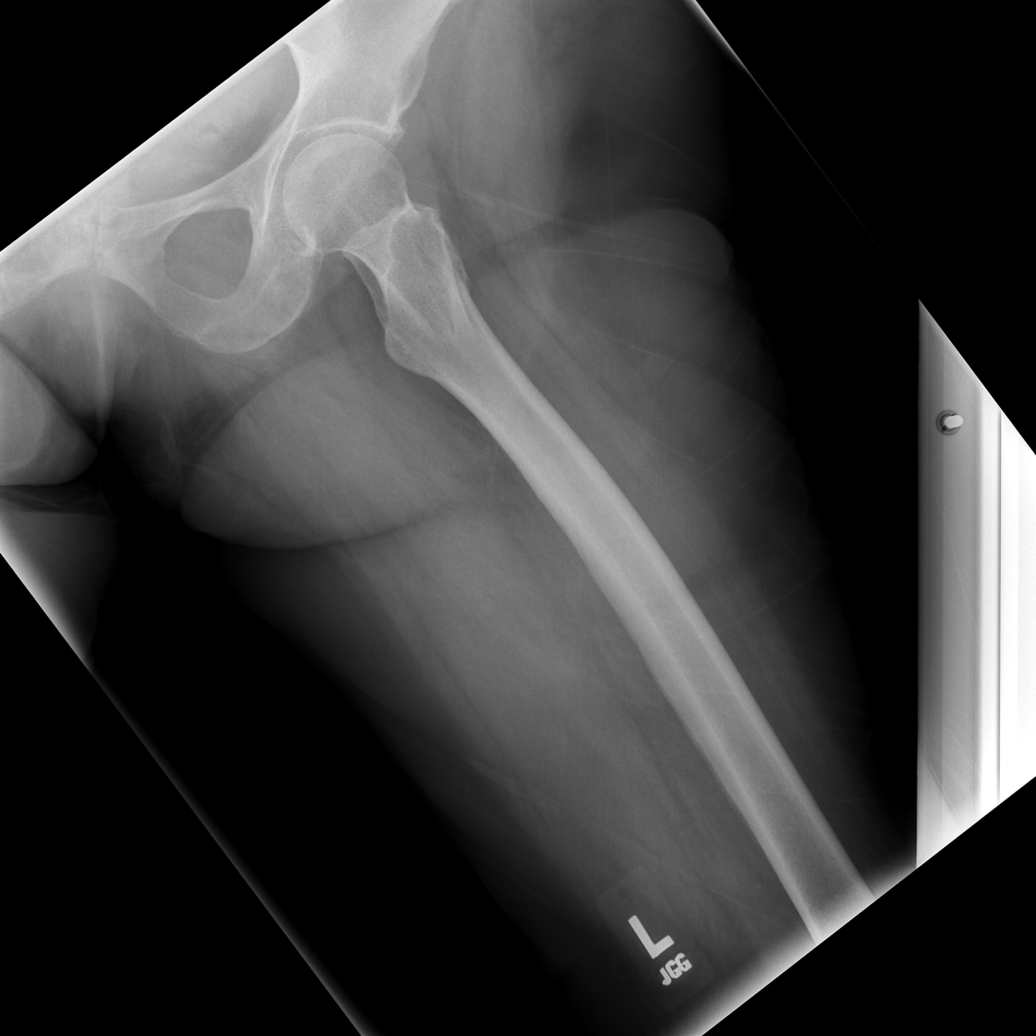

[t hip ap right]
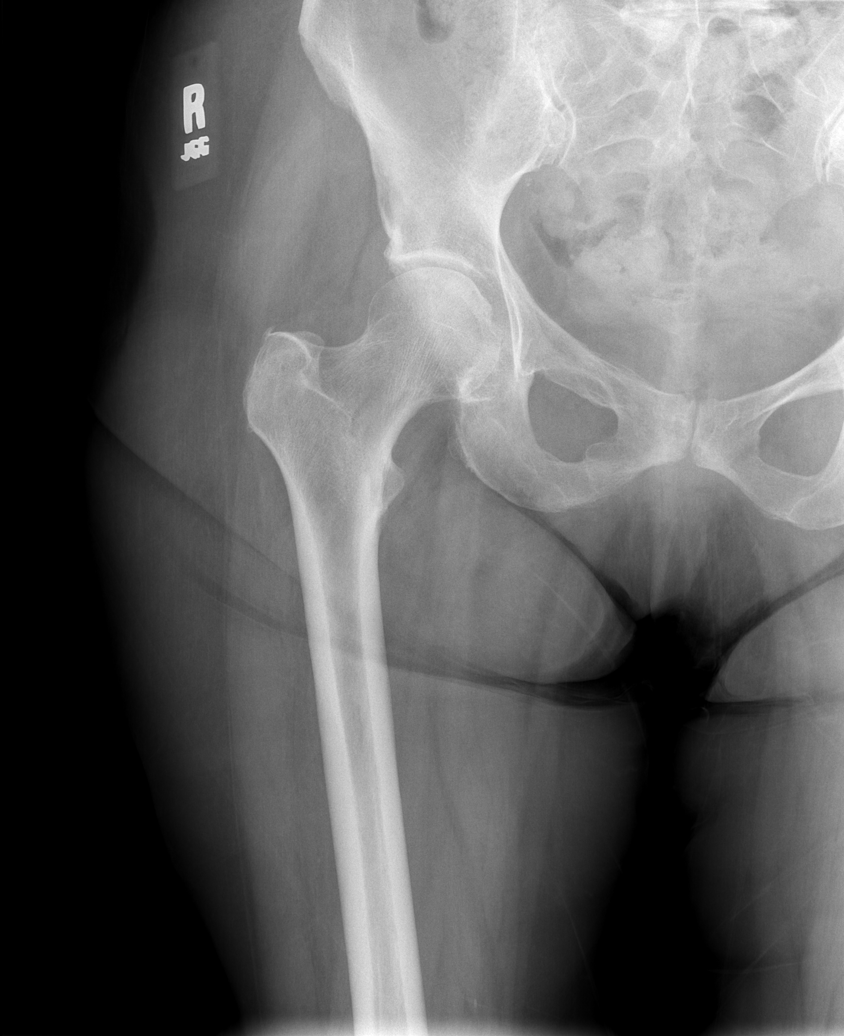

[t hip frog leg right]
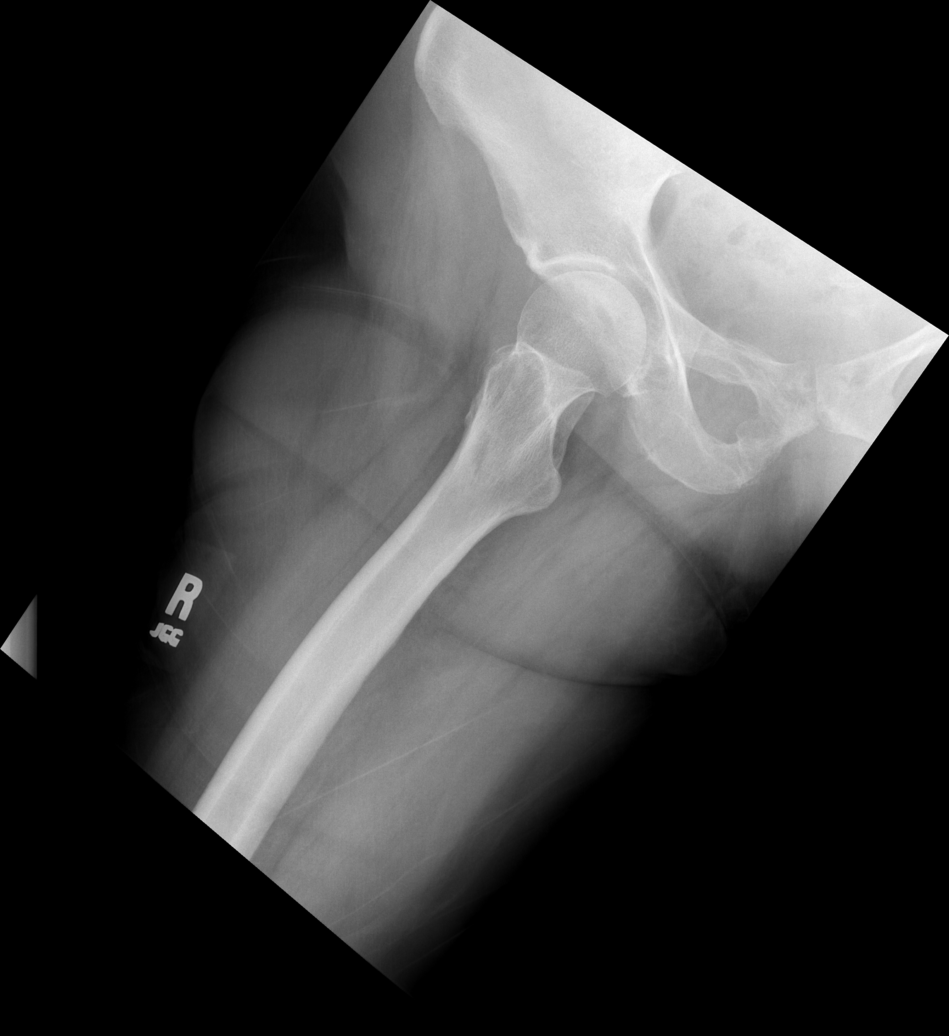

[5 of 5 positions shown; findings below may reference images not displayed]

FINDINGS: No acute fracture or dislocation is identified involving both hips
or the bony pelvis. Healed deformities of the superior and inferior
pubic rami on the right are consistent with prior fracture. Mild
osteoarthritis present in both hip joints. No bony lesions or
destruction identified. Soft tissues are unremarkable.
IMPRESSION: No acute fracture identified. Healed fractures of the right superior
and inferior pubic rami are identified.

## 2016-03-19 DIAGNOSIS — M1990 Unspecified osteoarthritis, unspecified site: Secondary | ICD-10-CM | POA: Diagnosis not present

## 2016-03-19 DIAGNOSIS — M8580 Other specified disorders of bone density and structure, unspecified site: Secondary | ICD-10-CM | POA: Diagnosis not present

## 2016-03-19 DIAGNOSIS — M79643 Pain in unspecified hand: Secondary | ICD-10-CM | POA: Diagnosis not present

## 2016-03-20 DIAGNOSIS — L821 Other seborrheic keratosis: Secondary | ICD-10-CM | POA: Diagnosis not present

## 2016-03-20 DIAGNOSIS — L918 Other hypertrophic disorders of the skin: Secondary | ICD-10-CM | POA: Diagnosis not present

## 2016-03-20 DIAGNOSIS — L814 Other melanin hyperpigmentation: Secondary | ICD-10-CM | POA: Diagnosis not present

## 2016-03-20 DIAGNOSIS — D235 Other benign neoplasm of skin of trunk: Secondary | ICD-10-CM | POA: Diagnosis not present

## 2016-03-20 DIAGNOSIS — D1801 Hemangioma of skin and subcutaneous tissue: Secondary | ICD-10-CM | POA: Diagnosis not present

## 2016-05-08 DIAGNOSIS — M8580 Other specified disorders of bone density and structure, unspecified site: Secondary | ICD-10-CM | POA: Diagnosis not present

## 2016-05-08 DIAGNOSIS — E7800 Pure hypercholesterolemia, unspecified: Secondary | ICD-10-CM | POA: Diagnosis not present

## 2016-05-08 DIAGNOSIS — Z0000 Encounter for general adult medical examination without abnormal findings: Secondary | ICD-10-CM | POA: Diagnosis not present

## 2016-05-08 DIAGNOSIS — I10 Essential (primary) hypertension: Secondary | ICD-10-CM | POA: Diagnosis not present

## 2016-05-15 DIAGNOSIS — R5383 Other fatigue: Secondary | ICD-10-CM | POA: Diagnosis not present

## 2016-05-15 DIAGNOSIS — Z8719 Personal history of other diseases of the digestive system: Secondary | ICD-10-CM | POA: Diagnosis not present

## 2016-05-15 DIAGNOSIS — E7800 Pure hypercholesterolemia, unspecified: Secondary | ICD-10-CM | POA: Diagnosis not present

## 2016-05-15 DIAGNOSIS — E042 Nontoxic multinodular goiter: Secondary | ICD-10-CM | POA: Diagnosis not present

## 2016-05-19 DIAGNOSIS — Z1212 Encounter for screening for malignant neoplasm of rectum: Secondary | ICD-10-CM | POA: Diagnosis not present

## 2016-05-19 DIAGNOSIS — Z1211 Encounter for screening for malignant neoplasm of colon: Secondary | ICD-10-CM | POA: Diagnosis not present

## 2016-07-10 ENCOUNTER — Ambulatory Visit: Payer: Medicare Other | Admitting: Neurology

## 2016-07-15 ENCOUNTER — Ambulatory Visit: Payer: Medicare Other | Admitting: Neurology

## 2016-07-31 DIAGNOSIS — R400 Somnolence: Secondary | ICD-10-CM | POA: Diagnosis not present

## 2016-07-31 DIAGNOSIS — G4733 Obstructive sleep apnea (adult) (pediatric): Secondary | ICD-10-CM | POA: Diagnosis not present

## 2016-07-31 DIAGNOSIS — R0683 Snoring: Secondary | ICD-10-CM | POA: Diagnosis not present

## 2016-07-31 DIAGNOSIS — I10 Essential (primary) hypertension: Secondary | ICD-10-CM | POA: Diagnosis not present

## 2016-08-29 DIAGNOSIS — M25551 Pain in right hip: Secondary | ICD-10-CM | POA: Diagnosis not present

## 2016-08-29 DIAGNOSIS — M545 Low back pain: Secondary | ICD-10-CM | POA: Diagnosis not present

## 2016-09-02 ENCOUNTER — Other Ambulatory Visit: Payer: Self-pay | Admitting: Internal Medicine

## 2016-09-02 DIAGNOSIS — Z1231 Encounter for screening mammogram for malignant neoplasm of breast: Secondary | ICD-10-CM

## 2016-09-04 ENCOUNTER — Encounter: Payer: Self-pay | Admitting: Neurology

## 2016-09-04 ENCOUNTER — Ambulatory Visit (INDEPENDENT_AMBULATORY_CARE_PROVIDER_SITE_OTHER): Payer: Medicare Other | Admitting: Neurology

## 2016-09-04 VITALS — BP 130/66 | HR 68 | Ht 65.0 in | Wt 151.5 lb

## 2016-09-04 DIAGNOSIS — R269 Unspecified abnormalities of gait and mobility: Secondary | ICD-10-CM

## 2016-09-04 DIAGNOSIS — G35 Multiple sclerosis: Secondary | ICD-10-CM

## 2016-09-04 DIAGNOSIS — M5442 Lumbago with sciatica, left side: Secondary | ICD-10-CM | POA: Diagnosis not present

## 2016-09-04 MED ORDER — GABAPENTIN 100 MG PO CAPS: 300.0000 mg | ORAL_CAPSULE | Freq: Three times a day (TID) | ORAL | 6 refills | Status: DC

## 2016-09-04 NOTE — Progress Notes (Signed)
Chief Complaint  Patient presents with  . Multiple Sclerosis    She is here with her daughter, Benjamine Mola, for her yearly follow up.  She has started having lower extremity pain that has caused more gait difficulty.  She was evaluated by her orthopaedic physcian and had back x-ray showing degenerative changes (no problems noted on hip xrays).  She finished up a six day steroid dose pack today that has been helpful for her pain.   Chief Complaint  Patient presents with  . Multiple Sclerosis    She is here with her daughter, Benjamine Mola, for her yearly follow up.  She has started having lower extremity pain that has caused more gait difficulty.  She was evaluated by her orthopaedic physcian and had back x-ray showing degenerative changes (no problems noted on hip xrays).  She finished up a six day steroid dose pack today that has been helpful for her pain.      PATIENT: Jennifer Atkins DOB: October 31, 1934  HISTORICAL  Jennifer Atkins is a 82 year old right-handed Caucasian female, followup for multiple sclerosis, her primary care physician is [facility] Dr. Shelia Media, last clinical visit was with Hoyle Sauer in May 2014  She had past medical history of relapsing remitting multiple sclerosis, was patient of Dr. Erling Cruz for many years, she had gait abnormality, she had right optic neuritis with symptoms beginning in 1980, with total recovery within one month after po steroid treatment, recurrent right optic neuritis again a year later.  Initially,   Over the years, she has developed gradual onset gait difficulty, especially since 2014, also fatigue, mild memory loss,   She has never been on any immunomodulating medications.   She was highly functional, driving, water aerobic, golf regularly, she has urinary urgency, nocturia, taking vesicare, occasionally bladder incontinence when she gets up from a seated position  She fell in Jan 2015, went to ED, had left forehead laceration,  previously, she fell in August 2014 at [facility]    She was referred to physical therapy afterwards, machine exercise has made her right leg worse, right foot pain.  She now complains of worsening right foot numbness, right knee pain, has right meniscus repair in the past, right leg numbness, weakness, she also has mild left foot numbness.   UPDATE July 01 2013:  She fell again in June 10th 2015, without warning signs, now with right thumb pain, now she has right foot numbness, multiple falling episodes over past one year. We have reviewed MRI of the brain, mild atrophy, scattered periventricular white matter disease, small vessel disease vs. MS lesions,  MRI thoracic spine,Subtle T2 hyperintensity at T6-7 level, may represent chronic demyelinating plaque. No acute plaques are seen.  MRI lumbar spine (without) demonstrating multilevel degenerative disc disease, most severe at L5-S1, disc bulging and facet hypertrophy with moderate right and mild left foraminal stenosis; potential impingement upon the right L5 and descending bilateral S1 roots. L3-4: disc bulging and facet hypertrophy with mild right and moderate left foraminal stenosis.  L4-5: disc bulging and facet hypertrophy with moderate right and mild left foraminal stenosis   MRI cervical:C4-5: disc bulging with mild biforaminal stenosis. C5-6. C6-7: disc bulging and uncovertebral joint hypertrophy with mild biforaminal stenosis. No intrinsic or compressive spinal cord lesions. No abnormal enhancing lesions.  UPDATE July 11 2014: She continues to do very well, exercise regularly, never was treated with immunomodulation therapy, she had right knee replacement October 2015, recovered very well. She complains of worsening bowel and bladder urgency,occasionally  incontinence,  She complains of lack of stamina,  UPDATE June 19th 2017: She still has right knee pain despite previous right knee replacement,, also left knee pain, she still exercise  regularly, water aerobic, which has been helpful, she does not drive on interstate, she has more right foot numbness, she does not feel confidence driving on the high way.  She still plays golf, she has six children, she will visit her son, she still lives in her house.  UPDATE September 04 2016: She is with her daughter at visit today, she complains of left hip pain, radiating pain to left leg since early August 2018, she was seen by orthopedic, was given a course of po steroid, which helps her some.  We have personally reviewed MRI seen 2015,MRI of the lumbar region showed multilevel significant degenerative changes, most severe at L5-S1, bulging disc and facet hypertrophy, moderate right, mild left foraminal narrowing, potential impingement on the right L5 nerve roots,  She also complains of slow worsening memory loss,    ROS: increase fatigue, low back pain, left hip pain, urinary urgency, memory loss, numbness,  HOME MEDICATIONS: Current Outpatient Prescriptions on File Prior to Visit  Medication Sig Dispense Refill  . Acetaminophen (TYLENOL ARTHRITIS PAIN PO) Take by mouth daily.    Marland Kitchen amLODipine (NORVASC) 2.5 MG tablet Take 2.5 mg by mouth daily.    Marland Kitchen aspirin 81 MG tablet Take 81 mg by mouth daily.    . calcium carbonate (TUMS - DOSED IN MG ELEMENTAL CALCIUM) 500 MG chewable tablet Chew 2 tablets by mouth 2 (two) times daily.    . Glucosamine-Chondroitin (GLUCOSAMINE CHONDR COMPLEX PO) Take by mouth 2 (two) times daily.    . Mirabegron (MYRBETRIQ PO) Take by mouth daily.    . Multiple Vitamins-Minerals (MULTIVITAMIN PO) Take 1 tablet by mouth daily.     . Omega-3 Fatty Acids (FISH OIL BURP-LESS PO) Take by mouth 3 (three) times daily.    . RESTASIS 0.05 % ophthalmic emulsion Place 1 drop into both eyes 2 (two) times daily.     . valsartan (DIOVAN) 320 MG tablet Take 320 mg by mouth daily.    Marland Kitchen VITAMIN D, CHOLECALCIFEROL, PO Take 1 tablet by mouth 2 (two) times daily.      No current  facility-administered medications on file prior to visit.     PAST MEDICAL HISTORY: Past Medical History:  Diagnosis Date  . Arthritis    "qwhere"  . Hypertension   . Memory loss    "from [place] & old age; I guess" (11/11/2013)  . MS (multiple sclerosis) ([place])   . Optic neuritis   . Urinary urgency   . Vertigo     PAST SURGICAL HISTORY: Past Surgical History:  Procedure Laterality Date  . CATARACT EXTRACTION W/ INTRAOCULAR LENS  IMPLANT, BILATERAL Bilateral 2013  . COLECTOMY  1980's   S/P colonoscopy  . JOINT REPLACEMENT    . KNEE ARTHROSCOPY Right 1994   "from staph infection"  . KNEE ARTHROSCOPY  2012   "meniscus repair"  . TONSILLECTOMY  ~ 1958  . TOTAL KNEE ARTHROPLASTY Right 11/10/2013  . TOTAL KNEE ARTHROPLASTY Right 11/10/2013   Procedure: RIGHT TOTAL KNEE ARTHROPLASTY;  Surgeon: Ninetta Lights, MD;  Location: [facility];  Service: Orthopedics;  Laterality: Right;  Marland Kitchen VAGINAL HYSTERECTOMY  1973    FAMILY HISTORY: Family History  Problem Relation Age of Onset  . Cancer Father   . Leukemia Mother   . Heart disease Unknown   .  Heart disease Maternal Grandmother     SOCIAL HISTORY:  Social History   Social History  . Marital status: Widowed    Spouse name: N/A  . Number of children: 6  . Years of education: College   Occupational History  .      Retired   Social History Main Topics  . Smoking status: Never Smoker  . Smokeless tobacco: Never Used  . Alcohol use 4.2 oz/week    7 Glasses of wine per week  . Drug use: No  . Sexual activity: No   Other Topics Concern  . Not on file   Social History Narrative   Patient is retired and has a Gaffer. She is a widow and has 6 children. She lives at the [place] at [facility].      PHYSICAL EXAM   There were no vitals filed for this visit.  Not recorded      There is no height or weight on file to calculate BMI.   Generalized: In no acute distress  Neck: Supple, no carotid bruits    Cardiac: Regular rate rhythm  Pulmonary: Clear to auscultation bilaterally  Musculoskeletal: No deformity  Neurological examination  Mentation: alert oriented to history taking and casual conversations  Cranial nerve II-XII: Pupils were equal round reactive to light. Extraocular movements were full.  Visual field were full on confrontational test. Bilateral fundi were sharp.  Facial sensation and strength were normal. Hearing was intact to finger rubbing bilaterally. Uvula tongue midline.  Head turning and shoulder shrug and were normal and symmetric.Tongue protrusion into cheek strength was normal.  Motor: Right thumb swelling tenderness, mild bilateral lower extremity spasticity  Sensory: Intact to fine touch, pinprick, preserved vibratory sensation, and proprioception at toes.  Coordination: Normal finger to nose, heel-to-shin bilaterally there was no truncal ataxia  Gait: Rising up from seated position by pushing on chair arm,  Bilateral valgrus knee, cautious, mildly unsteady, antalgic Romberg signs: Negative  Deep tendon reflexes: Brachioradialis 3/3, biceps 3/3, triceps 3/3, patellar 3/3, Achilles 2/2, nonsustained ankle clonus, plantar responses were extensor bilaterally.   DIAGNOSTIC DATA (LABS, IMAGING, TESTING) - I reviewed patient records, labs, notes, testing and imaging myself where available.    ASSESSMENT AND PLAN  MARYANNA STUBER is a 82 y.o. female  Possible multiple sclerosis,  There was no typical MRI findings,she never received a long term immunomodulation therapy Low back pain, radiating pain to left lower extremity  Most consistent with left lumbar radiculopathy,  Continue moderate exercise, water aerobics  Tylenol, gabapentin 300 mg 3 times a day  She will return to clinic in Sept 2018, if she continues to complain of left-sided low back pain, left hip pain, may consider referring her to pain management for epidural injection, even consider MRI of the  lumbar, EMG nerve conduction study  She is currently under the care of Dr. Percell Miller and [facility], lumbar -xray at their clinic in August 2018, significant degenerative changes, scoliosis   Marcial Pacas, M.D. Ph.D.  [facility] [address] [place], [place] [address] [phone]

## 2016-09-09 ENCOUNTER — Telehealth: Payer: Self-pay | Admitting: Neurology

## 2016-09-09 DIAGNOSIS — M5416 Radiculopathy, lumbar region: Secondary | ICD-10-CM

## 2016-09-09 NOTE — Telephone Encounter (Signed)
Pt called the clinic she is not tolerating gabapentin (NEURONTIN) 100 MG capsule. She is having cognitive issues, can't function, not being able to stay awake. She is taking 2 tab, afternoon 2 tab evening. Pt said it is not helping her legs she is wanting to taper off. Please call

## 2016-09-10 ENCOUNTER — Encounter: Payer: Self-pay | Admitting: *Deleted

## 2016-09-10 DIAGNOSIS — K5190 Ulcerative colitis, unspecified, without complications: Secondary | ICD-10-CM | POA: Insufficient documentation

## 2016-09-10 NOTE — Telephone Encounter (Signed)
She does not want to continue gabapentin.  Dr. Krista Blue had also offered Celebrex 100mg , BID but she informed me that she has a history of ulcerative colitis and does not do well on NSAIDS.  This information was not in her medical history but has now been added.  She would like to proceed with a MRI scan of her lumbar spine.  I told her I would need to discuss this with Dr. Krista Blue upon her return to the office in the morning.  If she would like to order the scan, then the patient is aware to expect a call for scheduling.

## 2016-09-10 NOTE — Addendum Note (Signed)
Addended by: Desmond Lope on: 09/10/2016 03:13 PM   Modules accepted: Orders

## 2016-09-10 NOTE — Telephone Encounter (Addendum)
Pt called this morning. She can be reached at [phone]. Her daughter is coming in from [place] today and if she does not answer she can be reached on her cell [phone]. Thank you

## 2016-09-10 NOTE — Telephone Encounter (Signed)
Patient is returning your call. She can be reached at [phone].

## 2016-09-10 NOTE — Telephone Encounter (Signed)
Left message on home number.  Also attempted to reach on mobile number - voicemail not set up.

## 2016-09-11 NOTE — Telephone Encounter (Signed)
Patient aware to expect a phone call for scheduling.

## 2016-09-11 NOTE — Addendum Note (Signed)
Addended by: Marcial Pacas on: 09/11/2016 02:00 PM   Modules accepted: Orders

## 2016-09-11 NOTE — Telephone Encounter (Signed)
I have ordered MRI of lumbar  to evaluate her recent worsening low back

## 2016-09-12 NOTE — Telephone Encounter (Signed)
Patient called office in reference to stopping Gabapentin on Tuesday.  Patient states she has flu like symptoms and feeling sick.  Weakness just feeling sick but doesn't know how to describe it.  She would like to know if this could be the after effects from stopping the Gabapentin.  Please call.  [place].

## 2016-09-12 NOTE — Telephone Encounter (Signed)
Spoke to patient - she has not been feeling well in general, along with increased fatigue, over the last several days.  She has not taken any gabapentin since Tuesday morning but she is still feeling the same way.  Reports starting a new blood pressure medication at the same time.  She wanted to know if she should continue the BP medication.  I instructed her to contact her PCP for further instruction since they manage this medication. She was agreeable and will call them today.

## 2016-09-12 NOTE — Telephone Encounter (Signed)
Correct phone number:  [phone].

## 2016-09-12 NOTE — Telephone Encounter (Signed)
Pt called the clinic there is a problem with the phones, she can be reached at [phone] (c)

## 2016-09-13 DIAGNOSIS — R6889 Other general symptoms and signs: Secondary | ICD-10-CM | POA: Diagnosis not present

## 2016-09-16 DIAGNOSIS — M549 Dorsalgia, unspecified: Secondary | ICD-10-CM | POA: Diagnosis not present

## 2016-09-16 DIAGNOSIS — M859 Disorder of bone density and structure, unspecified: Secondary | ICD-10-CM | POA: Diagnosis not present

## 2016-09-16 DIAGNOSIS — M8580 Other specified disorders of bone density and structure, unspecified site: Secondary | ICD-10-CM | POA: Diagnosis not present

## 2016-09-16 DIAGNOSIS — M1990 Unspecified osteoarthritis, unspecified site: Secondary | ICD-10-CM | POA: Diagnosis not present

## 2016-09-18 ENCOUNTER — Ambulatory Visit (INDEPENDENT_AMBULATORY_CARE_PROVIDER_SITE_OTHER): Payer: Medicare Other | Admitting: Neurology

## 2016-09-18 ENCOUNTER — Encounter: Payer: Self-pay | Admitting: Neurology

## 2016-09-18 VITALS — BP 128/77 | HR 72 | Ht 65.0 in | Wt 153.0 lb

## 2016-09-18 DIAGNOSIS — G35 Multiple sclerosis: Secondary | ICD-10-CM | POA: Diagnosis not present

## 2016-09-18 DIAGNOSIS — H469 Unspecified optic neuritis: Secondary | ICD-10-CM

## 2016-09-18 DIAGNOSIS — G4733 Obstructive sleep apnea (adult) (pediatric): Secondary | ICD-10-CM

## 2016-09-18 DIAGNOSIS — M5137 Other intervertebral disc degeneration, lumbosacral region: Secondary | ICD-10-CM

## 2016-09-18 DIAGNOSIS — M51379 Other intervertebral disc degeneration, lumbosacral region without mention of lumbar back pain or lower extremity pain: Secondary | ICD-10-CM | POA: Insufficient documentation

## 2016-09-18 NOTE — Patient Instructions (Signed)
Please remember to try to maintain good sleep hygiene, which means: Keep a regular sleep and wake schedule, try not to exercise or have a meal within 2 hours of your bedtime, try to keep your bedroom conducive for sleep, that is, cool and dark, without light distractors such as an illuminated alarm clock, and refrain from watching TV right before sleep or in the middle of the night and do not keep the TV or radio on during the night. Also, try not to use or play on electronic devices at bedtime, such as your cell phone, tablet PC or laptop. If you like to read at bedtime on an electronic device, try to dim the background light as much as possible. Do not eat in the middle of the night.   We will request a sleep study.    We will look for leg twitching and snoring or sleep apnea.   We will call you with the sleep study results and make a follow up appointment if needed.
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
## 2016-09-21 ENCOUNTER — Ambulatory Visit
Admission: RE | Admit: 2016-09-21 | Discharge: 2016-09-21 | Disposition: A | Discharge status: Home / Self Care | Payer: Medicare Other | Source: Ambulatory Visit | Attending: Neurology | Admitting: Neurology

## 2016-09-21 DIAGNOSIS — M5416 Radiculopathy, lumbar region: Secondary | ICD-10-CM | POA: Diagnosis not present

## 2016-09-21 DIAGNOSIS — M48061 Spinal stenosis, lumbar region without neurogenic claudication: Secondary | ICD-10-CM | POA: Diagnosis not present

## 2016-09-25 ENCOUNTER — Telehealth: Payer: Self-pay | Admitting: Neurology

## 2016-09-25 DIAGNOSIS — M48061 Spinal stenosis, lumbar region without neurogenic claudication: Secondary | ICD-10-CM

## 2016-09-25 NOTE — Telephone Encounter (Signed)
Spoke to patient - she is aware of results and would like to discuss treatment.  She could not tolerate the side effects of gabapentin.  She has history of ulcerative colitis and she cannot take NSAIDS.  She tried aspercreme but experienced a rash.

## 2016-09-25 NOTE — Telephone Encounter (Addendum)
Per vo by Dr. Krista Blue, refer to pain management for possible epidural steroid injections.

## 2016-09-25 NOTE — Telephone Encounter (Signed)
Please call patient, MRI of the lumbar spine showed multilevel degenerative changes, with evidence of moderate to severe lateral recess and foraminal stenosis involving L4-L5, L5-S1, with possible left L5 and right S1 nerve root compression, these findings could explain her worsening low back pain, radiating pain to left lower extremity   IMPRESSION:    This MRI of the lumbar spine without contrast shows the following: 1.    At L4-L5, there are degenerative changes causing moderately severe left lateral recess stenosis with milder right lateral recess stenosis and foraminal narrowing. There is possible left L5 nerve root compression. 2.    At L5-S1, there are degenerative changes causing moderately severe right lateral recess stenosis and moderate left lateral recess and right foraminal narrowing. There is possible right S1 nerve root compression. 3.    There are milder degenerative changes at the other lumbar levels as detailed above with less potential for nerve root compression. The changes at L3-L4 have slightly progressed when compared to the 2015 MRI.

## 2016-09-25 NOTE — Telephone Encounter (Signed)
Returned call to patient - she is agreeable to pain management.  Referral placed in Epic.

## 2016-09-25 NOTE — Addendum Note (Signed)
Addended by: Noberto Retort C on: 09/25/2016 06:06 PM   Modules accepted: Orders

## 2016-10-01 ENCOUNTER — Telehealth: Payer: Self-pay | Admitting: Neurology

## 2016-10-01 ENCOUNTER — Ambulatory Visit (INDEPENDENT_AMBULATORY_CARE_PROVIDER_SITE_OTHER): Payer: Medicare Other | Admitting: Neurology

## 2016-10-01 DIAGNOSIS — H469 Unspecified optic neuritis: Secondary | ICD-10-CM

## 2016-10-01 DIAGNOSIS — G4733 Obstructive sleep apnea (adult) (pediatric): Secondary | ICD-10-CM

## 2016-10-01 DIAGNOSIS — M51379 Other intervertebral disc degeneration, lumbosacral region without mention of lumbar back pain or lower extremity pain: Secondary | ICD-10-CM

## 2016-10-01 DIAGNOSIS — M5137 Other intervertebral disc degeneration, lumbosacral region: Secondary | ICD-10-CM

## 2016-10-01 DIAGNOSIS — G35 Multiple sclerosis: Secondary | ICD-10-CM
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
## 2016-10-02 DIAGNOSIS — Z23 Encounter for immunization: Secondary | ICD-10-CM | POA: Diagnosis not present

## 2016-10-02 DIAGNOSIS — I10 Essential (primary) hypertension: Secondary | ICD-10-CM | POA: Diagnosis not present

## 2016-10-02 DIAGNOSIS — M5432 Sciatica, left side: Secondary | ICD-10-CM | POA: Diagnosis not present

## 2016-10-02 NOTE — Telephone Encounter (Signed)
Patient has an apt with Arbie Cookey Dr. Nelva Bush - PA sept 19 th arrive at 9:15 for 9 :30 apt. Patient is aware and she is fine going to [facility] Dr. Nelva Bush - [place]. Patient is going to [employer].

## 2016-10-03 ENCOUNTER — Telehealth: Payer: Self-pay | Admitting: Neurology

## 2016-10-03 ENCOUNTER — Telehealth: Payer: Self-pay

## 2016-10-03 NOTE — Procedures (Signed)
PATIENT'S NAME:  Jennifer Atkins, Jennifer Atkins DOB:      10-Nov-1934      MR#:    [id number]     DATE OF RECORDING: 10/01/2016 REFERRING M.D.:  Deland Pretty, MD Study Performed:   Baseline Polysomnogram HISTORY:  This 82 year old female patient has undergone sleep testing (HST) under Dr. Tonia Ghent, but her sleep study from 07-31-2016 was not followed up on. She reportedly had an AHI of 25/hr. on a HST with [facility]. She sees Dr. Krista Blue in our office for a MS work up and was asked to follow up with a [place] sleep specialist. She reports fatigue, nocturia and fragmented sleep.  The patient endorsed the Epworth Sleepiness Scale at 8/24 points. FSS at 44 points.  The patient's weight 153 pounds with a height of 65 (inches), resulting in a BMI of 25.3 kg/m2. The patient's neck circumference measured 14 inches.  CURRENT MEDICATIONS: Tylenol Arthritis, Norvasc, Tums, Glucosamine, Avapro, Myrbetriq, Multivitamin, Omega 3, Restasis, Vitamin D   PROCEDURE:  This is a multichannel digital polysomnogram utilizing the SomnoStar 11.2 system.  Electrodes and sensors were applied and monitored per AASM Specifications.   EEG, EOG, Chin and Limb EMG, were sampled at 200 Hz.  ECG, Snore and Nasal Pressure, Thermal Airflow, Respiratory Effort, CPAP Flow and Pressure, Oximetry was sampled at 50 Hz. Digital video and audio were recorded.      BASELINE STUDY:  Lights Out was at 21:16 and Lights On at 05:04.  Total recording time (TRT) was 468 minutes, with a total sleep time (TST) of 336 minutes.   The patient's sleep latency was 68 minutes.  REM latency was 63 minutes.  The sleep efficiency was 71.8 %.     SLEEP ARCHITECTURE: WASO (Wake after sleep onset) was 59 minutes.  There were 11.5 minutes in Stage N1, 172 minutes Stage N2, 106 minutes Stage N3 and 46.5 minutes in Stage REM.  The percentage of Stage N1 was 3.4%, Stage N2 was 51.2%, Stage N3 was 31.5% and Stage R (REM sleep) was 13.8%.   RESPIRATORY ANALYSIS:  There were a total of  0 respiratory events:  0 obstructive apneas, 0 central apneas and 0 mixed apneas with a total of 0 apneas and an apnea index (AI) of 0 /hour. There were 0 hypopneas with a hypopnea index of 0 /hour. The patient also had 0 respiratory event related arousals (RERAs). The total APNEA/HYPOPNEA INDEX (AHI) was 0.0/hour and the total RESPIRATORY DISTURBANCE INDEX was 0 /hour.  The patient spent 3.5 minutes of total sleep time in the supine position and 333 minutes in non-supine. The supine AHI was 0.0 versus a non-supine AHI of 0.0.  OXYGEN SATURATION & C02:  The Wake baseline 02 saturation was 98%, with the lowest being 90%. Time spent below 89% saturation equaled 0 minutes. No findings of elevated End [place] CO2 during sleep.   PERIODIC LIMB MOVEMENTS:  The patient had a total of 282 Periodic Limb Movements.  The Periodic Limb Movement (PLM) index was 50.4 and the PLM Arousal index was 2.9/hour. The arousals were noted as: 18 were spontaneous, 16 were associated with PLMs, and none (0) were associated with respiratory events.  Audio and video analysis did not show any abnormal or unusual movements, behaviors, phonations or vocalizations.  The patient slept non supine, took 2 bathroom breaks.  Mild to moderate snoring was noted. EKG was in keeping with normal sinus rhythm (NSR). Post-study, the patient indicated that sleep was the same as usual.  IMPRESSION: Delayed sleep onset, PLMs, but no other sleep abnormalities were noted.  No Apnea seen. The patient may benefit from a restless leg evaluation.    RECOMMENDATIONS:    1. There were frequent  periodic limb movements of sleep (PLMS) with associated sleep disruption.  Consider treating the PLMS primarily.  Avoid caffeine-containing beverages and chocolate. 2. Correlate clinically for a history consistent with regarding restless legs syndrome (RLS).    Consider secondary restless legs syndrome.  Pharmacotherapy may be warranted.  Obtain a serum  ferritin level if the clinical history is consistent with RLS.  Consider iron therapy and evaluation for iron deficiency anemia if the serum ferritin level < 50 ng/mL.  Certain medications or substances may aggravate RLS and common offenders may include the following:  nicotine, caffeine, SSRIs, TCAs, phenothiazine, dopamine antagonists, diphenhydramine, and alcohol.   3. A follow up appointment will be scheduled in the Sleep Clinic at [facility]. The referring provider will be notified of the results.      I certify that I have reviewed the entire raw data recording prior to the issuance of this report in accordance with the Standards of Accreditation of the American Academy of Sleep Medicine (AASM)      Larey Seat, MD         10-03-2016  Diplomat, American Board of Psychiatry and Neurology  Diplomat, American Board of [place] Director, [place] [facility]  Cc Dr. Krista Blue / Cecille Rubin, GNP/ Dr. Audie Pinto

## 2016-10-03 NOTE — Telephone Encounter (Signed)
-----   Message from Larey Seat, MD sent at 10/03/2016  2:54 PM EDT ----- Inexplicable, this patient had no apnea on PSG, just PLMs- a  possible overlap with RLS or neuropathy to be evaluated. Her HST was wrong ( see history above) . She does not have to follow up with me as she has a primary neurologist. CD

## 2016-10-03 NOTE — Telephone Encounter (Signed)
I called pt. I advised her that her appt with Hoyle Sauer, NP tomorrow will need to be cancelled and rescheduled. Pt is agreeable to an appt on 11/06/16 at 3:45pm with Hoyle Sauer, NP, arrival at 3:30pm. Pt verbalized understanding of new appt date and time and that her appt tomorrow was cancelled.

## 2016-10-03 NOTE — Telephone Encounter (Signed)
Called the patient and made her aware that Dr Brett Fairy didn't see any sleep apnea on the sleep study. She did notice some restlessness and the patient stated that she was in pain from the issues in her hips. I informed her that we would send this information to Dr Concha Pyo her PCP.

## 2016-10-04 ENCOUNTER — Ambulatory Visit: Payer: Medicare Other | Admitting: Nurse Practitioner

## 2016-10-09 ENCOUNTER — Ambulatory Visit
Admission: RE | Admit: 2016-10-09 | Discharge: 2016-10-09 | Disposition: A | Discharge status: Home / Self Care | Payer: Medicare Other | Source: Ambulatory Visit | Attending: Internal Medicine | Admitting: Internal Medicine

## 2016-10-09 DIAGNOSIS — M5136 Other intervertebral disc degeneration, lumbar region: Secondary | ICD-10-CM | POA: Diagnosis not present

## 2016-10-09 DIAGNOSIS — Z1231 Encounter for screening mammogram for malignant neoplasm of breast: Secondary | ICD-10-CM

## 2016-10-31 DIAGNOSIS — M5136 Other intervertebral disc degeneration, lumbar region: Secondary | ICD-10-CM | POA: Diagnosis not present

## 2016-11-06 ENCOUNTER — Encounter: Payer: Self-pay | Admitting: Nurse Practitioner

## 2016-11-06 ENCOUNTER — Ambulatory Visit (INDEPENDENT_AMBULATORY_CARE_PROVIDER_SITE_OTHER): Payer: Medicare Other | Admitting: Nurse Practitioner

## 2016-11-06 VITALS — BP 156/72 | HR 63 | Ht 65.0 in | Wt 153.0 lb

## 2016-11-06 DIAGNOSIS — G35 Multiple sclerosis: Secondary | ICD-10-CM | POA: Diagnosis not present

## 2016-11-06 DIAGNOSIS — R269 Unspecified abnormalities of gait and mobility: Secondary | ICD-10-CM | POA: Diagnosis not present

## 2016-11-06 DIAGNOSIS — M5442 Lumbago with sciatica, left side: Secondary | ICD-10-CM

## 2016-11-06 NOTE — Progress Notes (Signed)
[facility]  PATIENT: Jennifer Atkins DOB: 1934/09/01   REASON FOR VISIT: Follow-up for multiple sclerosis, pain in the hips HISTORY FROM: Patient    HISTORY OF PRESENT ILLNESS:Jennifer Atkins is a 82 year old right-handed Caucasian female, followup for multiple sclerosis, her primary care physician is [facility] Dr. Shelia Media, last clinical visit was with Hoyle Sauer in May 2014  She had past medical history of relapsing remitting multiple sclerosis, was patient of Dr. Erling Cruz for many years, she had gait abnormality, she had right optic neuritis with symptoms beginning in 1980, with total recovery within one month after po steroid treatment, recurrent right optic neuritis again a year later.  Initially,   Over the years, she has developed gradual onset gait difficulty, especially since 2014, also fatigue, mild memory loss,   She has never been on any immunomodulating medications.   She was highly functional, driving, water aerobic, golf regularly, she has urinary urgency, nocturia, taking vesicare, occasionally bladder incontinence when she gets up from a seated position  She fell in Jan 2015, went to ED, had left forehead laceration, previously, she fell in August 2014 at [facility].    She was referred to physical therapy afterwards, machine exercise has made her right leg worse, right foot pain.  She now complains of worsening right foot numbness, right knee pain, has right meniscus repair in the past, right leg numbness, weakness, she also has mild left foot numbness.   UPDATE July 01 2013:  She fell again in June 10th 2015, without warning signs, now with right thumb pain, now she has right foot numbness, multiple falling episodes over past one year. We have reviewed MRI of the brain, mild atrophy, scattered periventricular white matter disease, small vessel disease vs. MS lesions,  MRI thoracic spine,Subtle T2 hyperintensity at T6-7 level,  may represent chronic demyelinating plaque. No acute plaques are seen.  MRI lumbar spine (without) demonstrating multilevel degenerative disc disease, most severe at L5-S1, disc bulging and facet hypertrophy with moderate right and mild left foraminal stenosis; potential impingement upon the right L5 and descending bilateral S1 roots. L3-4: disc bulging and facet hypertrophy with mild right and moderate left foraminal stenosis.  L4-5: disc bulging and facet hypertrophy with moderate right and mild left foraminal stenosis   MRI cervical:C4-5: disc bulging with mild biforaminal stenosis. C5-6. C6-7: disc bulging and uncovertebral joint hypertrophy with mild biforaminal stenosis. No intrinsic or compressive spinal cord lesions. No abnormal enhancing lesions.  UPDATE July 11 2014: She continues to do very well, exercise regularly, never was treated with immunomodulation therapy, she had right knee replacement October 2015, recovered very well. She complains of worsening bowel and bladder urgency,occasionally incontinence,  She complains of lack of stamina,  UPDATE June 19th 2017: She still has right knee pain despite previous right knee replacement,, also left knee pain, she still exercise regularly, water aerobic, which has been helpful, she does not drive [place] interstate, she has more right foot numbness, she does not feel confidence driving on the high way.  She still plays golf, she has six children, she will visit her son, she still lives in her house.  UPDATE September 04 2016: She is with her daughter at visit today, she complains of left hip pain, radiating pain to left leg since early August 2018, she was seen by orthopedic, was given a course of po steroid, which helps her some.  We have personally reviewed MRI seen 2015,MRI of the lumbar region showed multilevel significant  degenerative changes, most severe at L5-S1, bulging disc and facet hypertrophy, moderate right, mild left foraminal  narrowing, potential impingement on the right L5 nerve roots,  She also complains of slow worsening memory loss, UPDATE 10/17/2018[name], 82 year old female returns for follow-up. According to Dr. 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 There were no immunomodulating medications at that time. She complains of some numbness in her feet. She has a long history of back pain and most recent MRI shows multilevel significant degenerative changes. She had recent injection with  Dr. Nelva Bush and her pain is much improved. In fact she played golf yesterday 9 holes. She continues to do water aerobics 3 times a week. She is no longer complaining of hip  Pain. She returns for reevaluation REVIEW OF SYSTEMS: Full 14 system review of systems performed and notable only for those listed, all others are neg:  Constitutional: Fatigue Cardiovascular: neg Ear/Nose/Throat: neg  Skin: neg Eyes: neg Respiratory: neg Gastroitestinal: Urinary incontinence Hematology/Lymphatic: neg  Endocrine: neg Musculoskeletal: Long history of back pain Allergy/Immunology: neg Neurological: Numbness in the feet Psychiatric: neg Sleep : neg   ALLERGIES: Allergies  Allergen Reactions  . Latex Rash    Band-aids     HOME MEDICATIONS: Outpatient Medications Prior to Visit  Medication Sig Dispense Refill  . Acetaminophen (TYLENOL ARTHRITIS PAIN PO) Take 2 tablets by mouth daily.     Marland Kitchen amLODipine (NORVASC) 2.5 MG tablet Take 2.5 mg by mouth daily.    Marland Kitchen aspirin 81 MG tablet Take 81 mg by mouth daily.    . calcium carbonate (TUMS - DOSED IN MG ELEMENTAL CALCIUM) 500 MG chewable tablet Chew 2 tablets by mouth 2 (two) times daily.    . Glucosamine-Chondroitin (GLUCOSAMINE CHONDR COMPLEX PO) Take by mouth 2 (two) times daily.    . irbesartan (AVAPRO) 300 MG tablet Take 300 mg by mouth daily.  3    . Multiple Vitamins-Minerals (MULTIVITAMIN PO) Take 1 tablet by mouth daily.     Marland Kitchen MYRBETRIQ 50 MG TB24 tablet Take 50 mg by mouth daily.    . Omega-3 Fatty Acids (FISH OIL BURP-LESS PO) Take 2 capsules by mouth 2 (two) times daily.     . RESTASIS 0.05 % ophthalmic emulsion Place 1 drop into both eyes 2 (two) times daily.     Marland Kitchen VITAMIN D, CHOLECALCIFEROL, PO Take 1,000 Units by mouth 2 (two) times daily.     . Mirabegron (MYRBETRIQ PO) Take by mouth daily.     No facility-administered medications prior to visit.     PAST MEDICAL HISTORY: Past Medical History:  Diagnosis Date  . Arthritis    "qwhere"  . Hypertension   . Memory loss    "from [place] & old age; I guess" (11/11/2013)  . MS (multiple sclerosis) ([place])   . Optic neuritis   . Urinary urgency   . Vertigo     PAST SURGICAL HISTORY: Past Surgical History:  Procedure Laterality Date  . CATARACT EXTRACTION W/ INTRAOCULAR LENS  IMPLANT, BILATERAL Bilateral 2013  . COLECTOMY  1980's   S/P colonoscopy  . JOINT REPLACEMENT    . KNEE ARTHROSCOPY Right 1994   "from staph infection"  . KNEE ARTHROSCOPY  2012   "meniscus repair"  . TONSILLECTOMY  ~ 1958  . TOTAL KNEE ARTHROPLASTY Right 11/10/2013  . TOTAL KNEE ARTHROPLASTY Right 11/10/2013   Procedure: RIGHT TOTAL  KNEE ARTHROPLASTY;  Surgeon: Ninetta Lights, MD;  Location: [place];  Service: Orthopedics;  Laterality: Right;  Marland Kitchen VAGINAL HYSTERECTOMY  1973    FAMILY HISTORY: Family History  Problem Relation Age of Onset  . Cancer Father   . Leukemia Mother   . Heart disease Unknown   . Heart disease Maternal Grandmother   . Breast cancer Neg Hx     SOCIAL HISTORY: Social History   Social History  . Marital status: Widowed    Spouse name: N/A  . Number of children: 6  . Years of education: College   Occupational History  .      Retired   Social History Main Topics  . Smoking status: Never Smoker  . Smokeless tobacco: Never Used  . Alcohol use 4.2 oz/week    7  Glasses of wine per week  . Drug use: No  . Sexual activity: No   Other Topics Concern  . Not on file   Social History Narrative   Patient is retired and has a Gaffer. She is a widow and has 6 children. She lives at the [place] [facility].      PHYSICAL EXAM  Vitals:   11/06/16 1530  BP: (!) 156/72  Pulse: 63  Weight: 153 lb (69.4 kg)  Height: 5\' 5"  (1.651 m)   Body mass index is 25.46 kg/m.  Generalized: Well developed, in no acute distress  Head: normocephalic and atraumatic,. Oropharynx benign  Neck: Supple,   Musculoskeletal: No deformity   Neurological examination   Mentation: Alert oriented to time, place, history taking. Attention span and concentration appropriate. Recent and remote memory intact.  Follows all commands speech and language fluent.   Cranial nerve II-XII: Fundoscopic exam reveals sharp disc margins.Pupils were equal round reactive to light extraocular movements were full, visual field were full on confrontational test. Facial sensation and strength were normal. hearing was intact to finger rubbing bilaterally. Uvula tongue midline. head turning and shoulder shrug were normal and symmetric.Tongue protrusion into cheek strength was normal. Motor: Mild bilateral lower extremity spasticity fullll strength in the BUE, BLE, Sensory: normal and symmetric to light touch, pinprick, and  Vibration, in the upper and lower extremities Coordination: finger-nose-finger, heel-to-shin bilaterally, no dysmetria Reflexes: Brachioradialis 2/2, biceps 2/2, triceps 2/2, patellar 2/2, Achilles 2/2, plantar responses were flexor bilaterally. Gait and Station: Rising up from seated position without assistance, valgus knee bil steady gait DIAGNOSTIC DATA (LABS, IMAGING, TESTING) - I reviewed patient records, labs, notes, testing and imaging myself where available.  Lab Results  Component Value Date   WBC 9.3 11/12/2013   HGB 11.3 (L) 11/12/2013   HCT 34.5 (L)  11/12/2013   MCV 100.9 (H) 11/12/2013   PLT 251 11/12/2013      Component Value Date/Time   NA 140 11/12/2013 0544   K 4.5 11/12/2013 0544   CL 105 11/12/2013 0544   CO2 21 11/12/2013 0544   GLUCOSE 110 (H) 11/12/2013 0544   BUN 16 11/12/2013 0544   CREATININE 0.63 11/12/2013 0544   CALCIUM 8.8 11/12/2013 0544   PROT 7.3 11/02/2013 1149   ALBUMIN 4.1 11/02/2013 1149   AST 28 11/02/2013 1149   ALT 19 11/02/2013 1149   ALKPHOS 73 11/02/2013 1149   BILITOT 1.0 11/02/2013 1149   GFRNONAA 85 (L) 11/12/2013 0544   GFRAA >90 11/12/2013 0544    ASSESSMENT AND PLAN  82 y.o. year old female  has a past medical history of Arthritis; Hypertension; Memory  loss; MS (multiple sclerosis) ([place]); Optic neuritis; Urinary urgency; and Vertigo. To follow-up. She also has a long history of back pain and had recent injection with Dr. Nelva Bush    She never received a long term immunomodulation therapy for MS Low back pain, radiating pain to left lower extremity improved since injection by Dr. Nelva Bush   continue golf moderate exercise water aerobics             Currently not on meds from this office  Follow up yearly I spent 20  in total face to face time with the patient more than 50% of which was spent counseling and coordination of care, reviewing test results reviewing medications and discussing and reviewing the diagnosis of MS and low back pain and further treatment options. Continue exercise this is important, Dennie Bible, [facility], [facility], APRN  [facility] [address], [place], [place] [address] [phone]

## 2016-11-06 NOTE — Patient Instructions (Signed)
Currently not on meds from this office  Stay physically active Follow up yearly

## 2016-11-07 NOTE — Progress Notes (Signed)
I have reviewed and agreed above plan. 

## 2016-11-12 DIAGNOSIS — M25551 Pain in right hip: Secondary | ICD-10-CM | POA: Diagnosis not present

## 2016-11-12 DIAGNOSIS — M25561 Pain in right knee: Secondary | ICD-10-CM | POA: Diagnosis not present

## 2017-01-31 DIAGNOSIS — J019 Acute sinusitis, unspecified: Secondary | ICD-10-CM | POA: Diagnosis not present

## 2017-03-04 DIAGNOSIS — R05 Cough: Secondary | ICD-10-CM | POA: Diagnosis not present

## 2017-03-04 DIAGNOSIS — K219 Gastro-esophageal reflux disease without esophagitis: Secondary | ICD-10-CM | POA: Diagnosis not present

## 2017-03-06 DIAGNOSIS — L814 Other melanin hyperpigmentation: Secondary | ICD-10-CM | POA: Diagnosis not present

## 2017-03-06 DIAGNOSIS — L821 Other seborrheic keratosis: Secondary | ICD-10-CM | POA: Diagnosis not present

## 2017-03-06 DIAGNOSIS — D229 Melanocytic nevi, unspecified: Secondary | ICD-10-CM | POA: Diagnosis not present

## 2017-03-06 DIAGNOSIS — L819 Disorder of pigmentation, unspecified: Secondary | ICD-10-CM | POA: Diagnosis not present

## 2017-03-06 DIAGNOSIS — D1801 Hemangioma of skin and subcutaneous tissue: Secondary | ICD-10-CM | POA: Diagnosis not present

## 2017-03-18 DIAGNOSIS — R05 Cough: Secondary | ICD-10-CM | POA: Diagnosis not present

## 2017-03-18 DIAGNOSIS — K219 Gastro-esophageal reflux disease without esophagitis: Secondary | ICD-10-CM | POA: Diagnosis not present

## 2017-05-16 DIAGNOSIS — Z7982 Long term (current) use of aspirin: Secondary | ICD-10-CM | POA: Diagnosis not present

## 2017-05-16 DIAGNOSIS — M8580 Other specified disorders of bone density and structure, unspecified site: Secondary | ICD-10-CM | POA: Diagnosis not present

## 2017-05-16 DIAGNOSIS — E7800 Pure hypercholesterolemia, unspecified: Secondary | ICD-10-CM | POA: Diagnosis not present

## 2017-05-16 DIAGNOSIS — I10 Essential (primary) hypertension: Secondary | ICD-10-CM | POA: Diagnosis not present

## 2017-05-21 DIAGNOSIS — R413 Other amnesia: Secondary | ICD-10-CM | POA: Diagnosis not present

## 2017-05-21 DIAGNOSIS — E042 Nontoxic multinodular goiter: Secondary | ICD-10-CM | POA: Diagnosis not present

## 2017-05-21 DIAGNOSIS — I10 Essential (primary) hypertension: Secondary | ICD-10-CM | POA: Diagnosis not present

## 2017-05-21 DIAGNOSIS — E7800 Pure hypercholesterolemia, unspecified: Secondary | ICD-10-CM | POA: Diagnosis not present

## 2017-05-21 DIAGNOSIS — Z0000 Encounter for general adult medical examination without abnormal findings: Secondary | ICD-10-CM | POA: Diagnosis not present

## 2017-05-21 DIAGNOSIS — R5383 Other fatigue: Secondary | ICD-10-CM | POA: Diagnosis not present

## 2017-05-21 DIAGNOSIS — G35 Multiple sclerosis: Secondary | ICD-10-CM | POA: Diagnosis not present

## 2017-05-21 DIAGNOSIS — M5432 Sciatica, left side: Secondary | ICD-10-CM | POA: Diagnosis not present

## 2017-05-21 DIAGNOSIS — M8580 Other specified disorders of bone density and structure, unspecified site: Secondary | ICD-10-CM | POA: Diagnosis not present

## 2017-05-21 DIAGNOSIS — Z7982 Long term (current) use of aspirin: Secondary | ICD-10-CM | POA: Diagnosis not present

## 2017-05-21 DIAGNOSIS — K219 Gastro-esophageal reflux disease without esophagitis: Secondary | ICD-10-CM | POA: Diagnosis not present

## 2017-07-02 DIAGNOSIS — Z01812 Encounter for preprocedural laboratory examination: Secondary | ICD-10-CM | POA: Diagnosis not present

## 2017-07-02 DIAGNOSIS — M25512 Pain in left shoulder: Secondary | ICD-10-CM | POA: Diagnosis not present

## 2017-07-02 DIAGNOSIS — M25511 Pain in right shoulder: Secondary | ICD-10-CM | POA: Diagnosis not present

## 2017-07-08 DIAGNOSIS — M25511 Pain in right shoulder: Secondary | ICD-10-CM | POA: Diagnosis not present

## 2017-07-10 DIAGNOSIS — M25512 Pain in left shoulder: Secondary | ICD-10-CM | POA: Diagnosis not present

## 2017-07-14 DIAGNOSIS — M25512 Pain in left shoulder: Secondary | ICD-10-CM | POA: Diagnosis not present

## 2017-09-01 ENCOUNTER — Other Ambulatory Visit: Payer: Self-pay | Admitting: Internal Medicine

## 2017-09-01 DIAGNOSIS — Z1231 Encounter for screening mammogram for malignant neoplasm of breast: Secondary | ICD-10-CM

## 2017-09-01 DIAGNOSIS — M5416 Radiculopathy, lumbar region: Secondary | ICD-10-CM | POA: Diagnosis not present

## 2017-09-17 DIAGNOSIS — M8580 Other specified disorders of bone density and structure, unspecified site: Secondary | ICD-10-CM | POA: Diagnosis not present

## 2017-09-17 DIAGNOSIS — M549 Dorsalgia, unspecified: Secondary | ICD-10-CM | POA: Diagnosis not present

## 2017-09-17 DIAGNOSIS — M1990 Unspecified osteoarthritis, unspecified site: Secondary | ICD-10-CM | POA: Diagnosis not present

## 2017-09-23 DIAGNOSIS — M5136 Other intervertebral disc degeneration, lumbar region: Secondary | ICD-10-CM | POA: Diagnosis not present

## 2017-10-03 DIAGNOSIS — Z23 Encounter for immunization: Secondary | ICD-10-CM | POA: Diagnosis not present

## 2017-10-08 DIAGNOSIS — M533 Sacrococcygeal disorders, not elsewhere classified: Secondary | ICD-10-CM | POA: Diagnosis not present

## 2017-10-08 DIAGNOSIS — R52 Pain, unspecified: Secondary | ICD-10-CM | POA: Diagnosis not present

## 2017-10-13 ENCOUNTER — Ambulatory Visit
Admission: RE | Admit: 2017-10-13 | Discharge: 2017-10-13 | Disposition: A | Discharge status: Home / Self Care | Payer: Medicare Other | Source: Ambulatory Visit | Attending: Internal Medicine | Admitting: Internal Medicine

## 2017-10-13 DIAGNOSIS — Z1231 Encounter for screening mammogram for malignant neoplasm of breast: Secondary | ICD-10-CM

## 2017-10-14 ENCOUNTER — Other Ambulatory Visit: Payer: Self-pay | Admitting: Internal Medicine

## 2017-10-14 DIAGNOSIS — R928 Other abnormal and inconclusive findings on diagnostic imaging of breast: Secondary | ICD-10-CM

## 2017-10-16 ENCOUNTER — Ambulatory Visit
Admission: RE | Admit: 2017-10-16 | Discharge: 2017-10-16 | Disposition: A | Discharge status: Home / Self Care | Payer: Medicare Other | Source: Ambulatory Visit | Attending: Internal Medicine | Admitting: Internal Medicine

## 2017-10-16 ENCOUNTER — Other Ambulatory Visit: Payer: Self-pay | Admitting: Internal Medicine

## 2017-10-16 DIAGNOSIS — R922 Inconclusive mammogram: Secondary | ICD-10-CM | POA: Diagnosis not present

## 2017-10-16 DIAGNOSIS — R928 Other abnormal and inconclusive findings on diagnostic imaging of breast: Secondary | ICD-10-CM

## 2017-10-16 DIAGNOSIS — N6489 Other specified disorders of breast: Secondary | ICD-10-CM | POA: Diagnosis not present

## 2017-10-17 ENCOUNTER — Ambulatory Visit
Admission: RE | Admit: 2017-10-17 | Discharge: 2017-10-17 | Disposition: A | Discharge status: Home / Self Care | Payer: Medicare Other | Source: Ambulatory Visit | Attending: Internal Medicine | Admitting: Internal Medicine

## 2017-10-17 DIAGNOSIS — N6011 Diffuse cystic mastopathy of right breast: Secondary | ICD-10-CM | POA: Diagnosis not present

## 2017-10-17 DIAGNOSIS — R928 Other abnormal and inconclusive findings on diagnostic imaging of breast: Secondary | ICD-10-CM

## 2017-10-17 DIAGNOSIS — N6489 Other specified disorders of breast: Secondary | ICD-10-CM

## 2017-10-17 DIAGNOSIS — N6311 Unspecified lump in the right breast, upper outer quadrant: Secondary | ICD-10-CM | POA: Diagnosis not present

## 2017-10-17 HISTORY — PX: BREAST BIOPSY: SHX20

## 2017-10-22 DIAGNOSIS — M533 Sacrococcygeal disorders, not elsewhere classified: Secondary | ICD-10-CM | POA: Diagnosis not present

## 2017-11-06 ENCOUNTER — Ambulatory Visit (INDEPENDENT_AMBULATORY_CARE_PROVIDER_SITE_OTHER): Payer: Medicare Other | Admitting: Nurse Practitioner

## 2017-11-06 ENCOUNTER — Encounter: Payer: Self-pay | Admitting: Nurse Practitioner

## 2017-11-06 VITALS — BP 129/75 | HR 74 | Ht 65.0 in | Wt 154.6 lb

## 2017-11-06 DIAGNOSIS — R269 Unspecified abnormalities of gait and mobility: Secondary | ICD-10-CM | POA: Diagnosis not present

## 2017-11-06 DIAGNOSIS — G35 Multiple sclerosis: Secondary | ICD-10-CM

## 2017-11-06 DIAGNOSIS — R350 Frequency of micturition: Secondary | ICD-10-CM

## 2017-11-06 NOTE — Patient Instructions (Signed)
She never received a long term immunomodulation therapy for MS Low back pain, radiating pain to left lower extremity improved since injection by Dr. Nelva Bush   continue golf moderate exercise water aerobics             Currently not on meds from this office  Follow up yearly

## 2017-11-06 NOTE — Progress Notes (Signed)
[facility]  PATIENT: Jennifer Atkins DOB: 09/05/1934   REASON FOR VISIT: Follow-up for multiple sclerosis, pain in the hips HISTORY FROM: Patient    HISTORY OF PRESENT ILLNESS:Jennifer Atkins is a 83 year old right-handed Caucasian female, followup for multiple sclerosis, her primary care physician is [facility] Dr. Shelia Media, last clinical visit was with Hoyle Sauer in May 2014  She had past medical history of relapsing remitting multiple sclerosis, was patient of Dr. Erling Cruz for many years, she had gait abnormality, she had right optic neuritis with symptoms beginning in 1980, with total recovery within one month after po steroid treatment, recurrent right optic neuritis again a year later.  Initially,   Over the years, she has developed gradual onset gait difficulty, especially since 2014, also fatigue, mild memory loss,   She has never been on any immunomodulating medications.   She was highly functional, driving, water aerobic, golf regularly, she has urinary urgency, nocturia, taking vesicare, occasionally bladder incontinence when she gets up from a seated position  She fell in Jan 2015, went to ED, had left forehead laceration, previously, she fell in August 2014 at [facility].    She was referred to physical therapy afterwards, machine exercise has made her right leg worse, right foot pain.  She now complains of worsening right foot numbness, right knee pain, has right meniscus repair in the past, right leg numbness, weakness, she also has mild left foot numbness.   UPDATE July 01 2013:  She fell again in June 10th 2015, without warning signs, now with right thumb pain, now she has right foot numbness, multiple falling episodes over past one year. We have reviewed MRI of the brain, mild atrophy, scattered periventricular white matter disease, small vessel disease vs. MS lesions,  MRI thoracic spine,Subtle T2 hyperintensity at T6-7 level,  may represent chronic demyelinating plaque. No acute plaques are seen.  MRI lumbar spine (without) demonstrating multilevel degenerative disc disease, most severe at L5-S1, disc bulging and facet hypertrophy with moderate right and mild left foraminal stenosis; potential impingement upon the right L5 and descending bilateral S1 roots. L3-4: disc bulging and facet hypertrophy with mild right and moderate left foraminal stenosis.  L4-5: disc bulging and facet hypertrophy with moderate right and mild left foraminal stenosis   MRI cervical:C4-5: disc bulging with mild biforaminal stenosis. C5-6. C6-7: disc bulging and uncovertebral joint hypertrophy with mild biforaminal stenosis. No intrinsic or compressive spinal cord lesions. No abnormal enhancing lesions.  UPDATE July 11 2014: She continues to do very well, exercise regularly, never was treated with immunomodulation therapy, she had right knee replacement October 2015, recovered very well. She complains of worsening bowel and bladder urgency,occasionally incontinence,  She complains of lack of stamina,  UPDATE June 19th 2017: She still has right knee pain despite previous right knee replacement,, also left knee pain, she still exercise regularly, water aerobic, which has been helpful, she does not drive on interstate, she has more right foot numbness, she does not feel confidence driving on the high way.  She still plays golf, she has six children, she will visit her son, she still lives in her house.  UPDATE September 04 2016: She is with her daughter at visit today, she complains of left hip pain, radiating pain to left leg since early August 2018, she was seen by orthopedic, was given a course of po steroid, which helps her some.  We have personally reviewed MRI seen 2015,MRI of the lumbar region showed multilevel significant  degenerative changes, most severe at L5-S1, bulging disc and facet hypertrophy, moderate right, mild left foraminal  narrowing, potential impingement on the right L5 nerve roots,  She also complains of slow worsening memory loss, UPDATE 10/17/2018[name], 83 year old female returns for follow-up. According to Dr. 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 There were no immunomodulating medications at that time. She complains of some numbness in her feet. She has a long history of back pain and most recent MRI shows multilevel significant degenerative changes.  She had recent injection with  Dr. Nelva Bush and her pain is much improved. In fact she played golf yesterday 9 holes. She continues to do water aerobics 3 times a week. She is no longer complaining of hip  Pain. She returns for reevaluation UPDATE 11/06/2017 [name], 83 year old female returns for follow-up with a history of spinal cord multiple sclerosis.  She also had right optic neuritis in 1980 she has never been on immune O modulating medications.  She also has a history of low back pain and degenerative changes.  She continues to go to Dr. Nelva Bush for injections.  Her last injection was over a month ago.  She played 9 holes of golf last week.  Her last fall was in January 2019 without injury.  She continues to go to water aerobics 3 times a week.  She returns for reevaluation REVIEW OF SYSTEMS: Full 14 system review of systems performed and notable only for those listed, all others are neg:  Constitutional: Fatigue Cardiovascular: neg Ear/Nose/Throat: neg  Skin: neg Eyes: neg Respiratory: neg Gastroitestinal: Urinary incontinence Hematology/Lymphatic: neg  Endocrine: neg Musculoskeletal: Long history of back pain Allergy/Immunology: neg Neurological: Numbness in the feet Psychiatric: neg Sleep : neg   ALLERGIES: Allergies  Allergen Reactions  . Latex Rash    Band-aids     HOME MEDICATIONS: Outpatient  Medications Prior to Visit  Medication Sig Dispense Refill  . Acetaminophen (TYLENOL ARTHRITIS PAIN PO) Take 2 tablets by mouth daily.     Marland Kitchen amLODipine (NORVASC) 2.5 MG tablet Take 2.5 mg by mouth daily.    . calcium carbonate (TUMS - DOSED IN MG ELEMENTAL CALCIUM) 500 MG chewable tablet Chew 2 tablets by mouth 2 (two) times daily.    . Glucosamine-Chondroitin (GLUCOSAMINE CHONDR COMPLEX PO) Take by mouth 2 (two) times daily.    . irbesartan (AVAPRO) 300 MG tablet Take 300 mg by mouth daily.  3  . Multiple Vitamins-Minerals (MULTIVITAMIN PO) Take 1 tablet by mouth daily.     Marland Kitchen MYRBETRIQ 50 MG TB24 tablet Take 50 mg by mouth daily.    . Omega-3 Fatty Acids (FISH OIL BURP-LESS PO) Take 2 capsules by mouth 2 (two) times daily.     . ranitidine (ZANTAC) 150 MG tablet Take 150 mg by mouth 2 (two) times daily.  4  . RESTASIS 0.05 % ophthalmic emulsion Place 1 drop into both eyes 2 (two) times daily.     Marland Kitchen VITAMIN D, CHOLECALCIFEROL, PO Take 1,000 Units by mouth 2 (two) times daily.     Marland Kitchen aspirin 81 MG tablet Take 81 mg by mouth daily.     No facility-administered medications prior to visit.     PAST MEDICAL HISTORY: Past Medical History:  Diagnosis Date  . Arthritis    "qwhere"  . Hypertension   . Memory loss    "from [place] & old age;  I guess" (11/11/2013)  . MS (multiple sclerosis) ([place])   . Optic neuritis   . Urinary urgency   . Vertigo     PAST SURGICAL HISTORY: Past Surgical History:  Procedure Laterality Date  . CATARACT EXTRACTION W/ INTRAOCULAR LENS  IMPLANT, BILATERAL Bilateral 2013  . COLECTOMY  1980's   S/P colonoscopy  . JOINT REPLACEMENT    . KNEE ARTHROSCOPY Right 1994   "from staph infection"  . KNEE ARTHROSCOPY  2012   "meniscus repair"  . TONSILLECTOMY  ~ 1958  . TOTAL KNEE ARTHROPLASTY Right 11/10/2013  . TOTAL KNEE ARTHROPLASTY Right 11/10/2013   Procedure: RIGHT TOTAL KNEE ARTHROPLASTY;  Surgeon: Ninetta Lights, MD;  Location: [facility];  Service: Orthopedics;   Laterality: Right;  Marland Kitchen VAGINAL HYSTERECTOMY  1973    FAMILY HISTORY: Family History  Problem Relation Age of Onset  . Cancer Father   . Leukemia Mother   . Heart disease Unknown   . Heart disease Maternal Grandmother   . Breast cancer Maternal Aunt     SOCIAL HISTORY: Social History   Socioeconomic History  . Marital status: Widowed    Spouse name: Not on file  . Number of children: 6  . Years of education: College  . Highest education level: Not on file  Occupational History    Comment: Retired  Scientific laboratory technician  . Financial resource strain: Not on file  . Food insecurity:    Worry: Not on file    Inability: Not on file  . Transportation needs:    Medical: Not on file    Non-medical: Not on file  Tobacco Use  . Smoking status: Never Smoker  . Smokeless tobacco: Never Used  Substance and Sexual Activity  . Alcohol use: Yes    Alcohol/week: 7.0 standard drinks    Types: 7 Glasses of wine per week  . Drug use: No  . Sexual activity: Never  Lifestyle  . Physical activity:    Days per week: Not on file    Minutes per session: Not on file  . Stress: Not on file  Relationships  . Social connections:    Talks on phone: Not on file    Gets together: Not on file    Attends religious service: Not on file    Active member of club or organization: Not on file    Attends meetings of clubs or organizations: Not on file    Relationship status: Not on file  . Intimate partner violence:    Fear of current or ex partner: Not on file    Emotionally abused: Not on file    Physically abused: Not on file    Forced sexual activity: Not on file  Other Topics Concern  . Not on file  Social History Narrative   Patient is retired and has a Gaffer. She is a widow and has 6 children. She lives at the [place] [facility].      PHYSICAL EXAM  Vitals:   11/06/17 0906  BP: 129/75  Pulse: 74  Weight: 154 lb 9.6 oz (70.1 kg)  Height: 5\' 5"  (1.651 m)   Body mass index is  25.73 kg/m.  Generalized: Well developed, in no acute distress  Head: normocephalic and atraumatic,. Oropharynx benign  Neck: Supple,   Musculoskeletal: No deformity   Neurological examination   Mentation: Alert oriented to time, place, history taking. Attention span and concentration appropriate. Recent and remote memory intact.  Follows all commands speech and language  fluent.   Cranial nerve II-XII: Pupils were equal round reactive to light extraocular movements were full, visual field were full on confrontational test. Facial sensation and strength were normal. hearing was intact to finger rubbing bilaterally. Uvula tongue midline. head turning and shoulder shrug were normal and symmetric.Tongue protrusion into cheek strength was normal. Motor: Mild bilateral lower extremity spasticity full strength in the BUE, BLE, Sensory: normal and symmetric to light touch, pinprick, and  Vibration, in the upper and lower extremities  Coordination: finger-nose-finger, heel-to-shin bilaterally, no dysmetria Reflexes: Symmetric upper and lower plantar responses were flexor bilaterally. Gait and Station: Rising up from seated position without assistance, valgus knee bil steady gait.  No difficulty with turns no assistive device DIAGNOSTIC DATA (LABS, IMAGING, TESTING) - I reviewed patient records, labs, notes, testing and imaging myself where available.  Lab Results  Component Value Date   WBC 9.3 11/12/2013   HGB 11.3 (L) 11/12/2013   HCT 34.5 (L) 11/12/2013   MCV 100.9 (H) 11/12/2013   PLT 251 11/12/2013      Component Value Date/Time   NA 140 11/12/2013 0544   K 4.5 11/12/2013 0544   CL 105 11/12/2013 0544   CO2 21 11/12/2013 0544   GLUCOSE 110 (H) 11/12/2013 0544   BUN 16 11/12/2013 0544   CREATININE 0.63 11/12/2013 0544   CALCIUM 8.8 11/12/2013 0544   PROT 7.3 11/02/2013 1149   ALBUMIN 4.1 11/02/2013 1149   AST 28 11/02/2013 1149   ALT 19 11/02/2013 1149   ALKPHOS 73 11/02/2013  1149   BILITOT 1.0 11/02/2013 1149   GFRNONAA 85 (L) 11/12/2013 0544   GFRAA >90 11/12/2013 0544    ASSESSMENT AND PLAN  83 y.o. year old female  has a past medical history of Arthritis; Hypertension; Memory loss; MS (multiple sclerosis) ([place]); Optic neuritis; Urinary urgency; and Vertigo. To follow-up. She also has a long history of back pain and had recent injection with Dr. Nelva Bush    She never received a long term immunomodulation therapy for MS Low back pain, radiating pain to left lower extremity improved since injection by Dr. Nelva Bush   continue golf moderate exercise water aerobics             Currently not on meds from this office  Follow up yearly Dennie Bible, [facility], [facility], [facility] [address], [place], [place] [address] [phone]

## 2017-11-11 NOTE — Progress Notes (Signed)
I have reviewed and agreed above plan. 

## 2017-11-13 ENCOUNTER — Other Ambulatory Visit: Payer: Self-pay | Admitting: Chiropractic Medicine

## 2017-11-13 DIAGNOSIS — M48061 Spinal stenosis, lumbar region without neurogenic claudication: Secondary | ICD-10-CM

## 2017-11-26 ENCOUNTER — Ambulatory Visit
Admission: RE | Admit: 2017-11-26 | Discharge: 2017-11-26 | Disposition: A | Discharge status: Home / Self Care | Payer: Medicare Other | Source: Ambulatory Visit | Attending: Chiropractic Medicine | Admitting: Chiropractic Medicine

## 2017-11-26 DIAGNOSIS — M48061 Spinal stenosis, lumbar region without neurogenic claudication: Secondary | ICD-10-CM | POA: Diagnosis not present

## 2017-11-27 DIAGNOSIS — Z780 Asymptomatic menopausal state: Secondary | ICD-10-CM | POA: Diagnosis not present

## 2017-11-27 DIAGNOSIS — Z1212 Encounter for screening for malignant neoplasm of rectum: Secondary | ICD-10-CM | POA: Diagnosis not present

## 2017-11-27 DIAGNOSIS — Z01419 Encounter for gynecological examination (general) (routine) without abnormal findings: Secondary | ICD-10-CM | POA: Diagnosis not present

## 2017-12-08 DIAGNOSIS — H16223 Keratoconjunctivitis sicca, not specified as Sjogren's, bilateral: Secondary | ICD-10-CM | POA: Diagnosis not present

## 2018-01-01 DIAGNOSIS — M533 Sacrococcygeal disorders, not elsewhere classified: Secondary | ICD-10-CM | POA: Diagnosis not present

## 2018-02-05 DIAGNOSIS — L821 Other seborrheic keratosis: Secondary | ICD-10-CM | POA: Diagnosis not present

## 2018-02-05 DIAGNOSIS — D229 Melanocytic nevi, unspecified: Secondary | ICD-10-CM | POA: Diagnosis not present

## 2018-02-05 DIAGNOSIS — D1801 Hemangioma of skin and subcutaneous tissue: Secondary | ICD-10-CM | POA: Diagnosis not present

## 2018-02-05 DIAGNOSIS — L819 Disorder of pigmentation, unspecified: Secondary | ICD-10-CM | POA: Diagnosis not present

## 2018-02-05 DIAGNOSIS — L814 Other melanin hyperpigmentation: Secondary | ICD-10-CM | POA: Diagnosis not present

## 2018-04-29 DIAGNOSIS — M5416 Radiculopathy, lumbar region: Secondary | ICD-10-CM | POA: Diagnosis not present

## 2018-05-14 DIAGNOSIS — M5416 Radiculopathy, lumbar region: Secondary | ICD-10-CM | POA: Diagnosis not present

## 2018-05-22 DIAGNOSIS — M5416 Radiculopathy, lumbar region: Secondary | ICD-10-CM | POA: Diagnosis not present

## 2018-05-29 DIAGNOSIS — M5416 Radiculopathy, lumbar region: Secondary | ICD-10-CM | POA: Diagnosis not present

## 2018-06-01 DIAGNOSIS — E7800 Pure hypercholesterolemia, unspecified: Secondary | ICD-10-CM | POA: Diagnosis not present

## 2018-06-01 DIAGNOSIS — I10 Essential (primary) hypertension: Secondary | ICD-10-CM | POA: Diagnosis not present

## 2018-06-04 DIAGNOSIS — E7800 Pure hypercholesterolemia, unspecified: Secondary | ICD-10-CM | POA: Diagnosis not present

## 2018-06-04 DIAGNOSIS — G35 Multiple sclerosis: Secondary | ICD-10-CM | POA: Diagnosis not present

## 2018-06-04 DIAGNOSIS — Z8719 Personal history of other diseases of the digestive system: Secondary | ICD-10-CM | POA: Diagnosis not present

## 2018-06-04 DIAGNOSIS — I10 Essential (primary) hypertension: Secondary | ICD-10-CM | POA: Diagnosis not present

## 2018-06-04 DIAGNOSIS — K219 Gastro-esophageal reflux disease without esophagitis: Secondary | ICD-10-CM | POA: Diagnosis not present

## 2018-06-04 DIAGNOSIS — M8580 Other specified disorders of bone density and structure, unspecified site: Secondary | ICD-10-CM | POA: Diagnosis not present

## 2018-06-04 DIAGNOSIS — R32 Unspecified urinary incontinence: Secondary | ICD-10-CM | POA: Diagnosis not present

## 2018-06-19 DIAGNOSIS — N3941 Urge incontinence: Secondary | ICD-10-CM | POA: Diagnosis not present

## 2018-06-19 DIAGNOSIS — R350 Frequency of micturition: Secondary | ICD-10-CM | POA: Diagnosis not present

## 2018-06-19 DIAGNOSIS — R351 Nocturia: Secondary | ICD-10-CM | POA: Diagnosis not present

## 2018-07-02 DIAGNOSIS — N3941 Urge incontinence: Secondary | ICD-10-CM | POA: Diagnosis not present

## 2018-07-09 DIAGNOSIS — N3941 Urge incontinence: Secondary | ICD-10-CM | POA: Diagnosis not present

## 2018-07-16 DIAGNOSIS — N3941 Urge incontinence: Secondary | ICD-10-CM | POA: Diagnosis not present

## 2018-07-16 DIAGNOSIS — R350 Frequency of micturition: Secondary | ICD-10-CM | POA: Diagnosis not present

## 2018-07-23 DIAGNOSIS — R350 Frequency of micturition: Secondary | ICD-10-CM | POA: Diagnosis not present

## 2018-07-23 DIAGNOSIS — N3941 Urge incontinence: Secondary | ICD-10-CM | POA: Diagnosis not present

## 2018-07-30 DIAGNOSIS — N3941 Urge incontinence: Secondary | ICD-10-CM | POA: Diagnosis not present

## 2018-07-30 DIAGNOSIS — R350 Frequency of micturition: Secondary | ICD-10-CM | POA: Diagnosis not present

## 2018-08-06 DIAGNOSIS — R350 Frequency of micturition: Secondary | ICD-10-CM | POA: Diagnosis not present

## 2018-08-06 DIAGNOSIS — N3941 Urge incontinence: Secondary | ICD-10-CM | POA: Diagnosis not present

## 2018-08-13 DIAGNOSIS — R350 Frequency of micturition: Secondary | ICD-10-CM | POA: Diagnosis not present

## 2018-08-13 DIAGNOSIS — N3941 Urge incontinence: Secondary | ICD-10-CM | POA: Diagnosis not present

## 2018-08-20 DIAGNOSIS — R350 Frequency of micturition: Secondary | ICD-10-CM | POA: Diagnosis not present

## 2018-08-20 DIAGNOSIS — N3941 Urge incontinence: Secondary | ICD-10-CM | POA: Diagnosis not present

## 2018-08-27 DIAGNOSIS — N3941 Urge incontinence: Secondary | ICD-10-CM | POA: Diagnosis not present

## 2018-08-27 DIAGNOSIS — R350 Frequency of micturition: Secondary | ICD-10-CM | POA: Diagnosis not present

## 2018-09-03 DIAGNOSIS — R350 Frequency of micturition: Secondary | ICD-10-CM | POA: Diagnosis not present

## 2018-09-03 DIAGNOSIS — N3941 Urge incontinence: Secondary | ICD-10-CM | POA: Diagnosis not present

## 2018-09-07 ENCOUNTER — Other Ambulatory Visit: Payer: Self-pay | Admitting: Internal Medicine

## 2018-09-07 DIAGNOSIS — Z1231 Encounter for screening mammogram for malignant neoplasm of breast: Secondary | ICD-10-CM

## 2018-09-10 DIAGNOSIS — N3941 Urge incontinence: Secondary | ICD-10-CM | POA: Diagnosis not present

## 2018-09-10 DIAGNOSIS — R350 Frequency of micturition: Secondary | ICD-10-CM | POA: Diagnosis not present

## 2018-09-17 DIAGNOSIS — R350 Frequency of micturition: Secondary | ICD-10-CM | POA: Diagnosis not present

## 2018-09-17 DIAGNOSIS — N3941 Urge incontinence: Secondary | ICD-10-CM | POA: Diagnosis not present

## 2018-09-18 DIAGNOSIS — Z23 Encounter for immunization: Secondary | ICD-10-CM | POA: Diagnosis not present

## 2018-09-21 DIAGNOSIS — M8589 Other specified disorders of bone density and structure, multiple sites: Secondary | ICD-10-CM | POA: Diagnosis not present

## 2018-09-21 DIAGNOSIS — M8580 Other specified disorders of bone density and structure, unspecified site: Secondary | ICD-10-CM | POA: Diagnosis not present

## 2018-09-21 DIAGNOSIS — M549 Dorsalgia, unspecified: Secondary | ICD-10-CM | POA: Diagnosis not present

## 2018-09-21 DIAGNOSIS — M1990 Unspecified osteoarthritis, unspecified site: Secondary | ICD-10-CM | POA: Diagnosis not present

## 2018-10-08 DIAGNOSIS — R350 Frequency of micturition: Secondary | ICD-10-CM | POA: Diagnosis not present

## 2018-10-08 DIAGNOSIS — N3941 Urge incontinence: Secondary | ICD-10-CM | POA: Diagnosis not present

## 2018-10-21 ENCOUNTER — Other Ambulatory Visit: Payer: Self-pay

## 2018-10-21 ENCOUNTER — Ambulatory Visit
Admission: RE | Admit: 2018-10-21 | Discharge: 2018-10-21 | Disposition: A | Discharge status: Home / Self Care | Payer: Medicare Other | Source: Ambulatory Visit | Attending: Internal Medicine | Admitting: Internal Medicine

## 2018-10-21 DIAGNOSIS — Z1231 Encounter for screening mammogram for malignant neoplasm of breast: Secondary | ICD-10-CM

## 2018-10-29 DIAGNOSIS — N3941 Urge incontinence: Secondary | ICD-10-CM | POA: Diagnosis not present

## 2018-11-12 ENCOUNTER — Encounter: Payer: Self-pay | Admitting: Neurology

## 2018-11-12 ENCOUNTER — Ambulatory Visit (INDEPENDENT_AMBULATORY_CARE_PROVIDER_SITE_OTHER): Payer: Medicare Other | Admitting: Neurology

## 2018-11-12 ENCOUNTER — Other Ambulatory Visit: Payer: Self-pay

## 2018-11-12 VITALS — BP 122/72 | HR 62 | Temp 97.6°F | Ht 65.0 in | Wt 157.0 lb

## 2018-11-12 DIAGNOSIS — G35 Multiple sclerosis: Secondary | ICD-10-CM

## 2018-11-12 DIAGNOSIS — R269 Unspecified abnormalities of gait and mobility: Secondary | ICD-10-CM

## 2018-11-12 NOTE — Progress Notes (Signed)
[facility]  PATIENT: Jennifer Atkins DOB: 18-May-1934   HISTORY OF PRESENT ILLNESS: Jennifer Atkins is a 84 year old right-handed Caucasian female, followup for multiple sclerosis, her primary care physician is [facility] Dr. Shelia Media, last clinical visit was with Hoyle Sauer in May 2014  She had past medical history of relapsing remitting multiple sclerosis, was patient of Dr. Erling Cruz for many years, she had gait abnormality, she had right optic neuritis with symptoms beginning in 1980, with total recovery within one month after po steroid treatment, recurrent right optic neuritis again a year later.  Initially,   Over the years, she has developed gradual onset gait difficulty, especially since 2014, also fatigue, mild memory loss,   She has never been on any immunomodulating medications.   She was highly functional, driving, water aerobic, golf regularly, she has urinary urgency, nocturia, taking vesicare, occasionally bladder incontinence when she gets up from a seated position  She fell in Jan 2015, went to ED, had left forehead laceration, previously, she fell in August 2014 at [facility].    She was referred to physical therapy afterwards, machine exercise has made her right leg worse, right foot pain.  She now complains of worsening right foot numbness, right knee pain, has right meniscus repair in the past, right leg numbness, weakness, she also has mild left foot numbness.   UPDATE July 01 2013: She fell again on June 10th 2015, without warning signs, now with right thumb pain, now she has right foot numbness, multiple falling episodes over past one year. We have reviewed MRI of the brain, mild atrophy, scattered periventricular white matter disease, small vessel disease vs. MS lesions,  MRI thoracic spine,Subtle T2 hyperintensity at T6-7 level, may represent chronic demyelinating plaque. No acute plaques are seen.  MRI lumbar spine  (without) demonstrating multilevel degenerative disc disease, most severe at L5-S1, disc bulging and facet hypertrophy with moderate right and mild left foraminal stenosis; potential impingement upon the right L5 and descending bilateral S1 roots. L3-4: disc bulging and facet hypertrophy with mild right and moderate left foraminal stenosis.  L4-5: disc bulging and facet hypertrophy with moderate right and mild left foraminal stenosis   MRI cervical:C4-5: disc bulging with mild biforaminal stenosis. C5-6. C6-7: disc bulging and uncovertebral joint hypertrophy with mild biforaminal stenosis. No intrinsic or compressive spinal cord lesions. No abnormal enhancing lesions.  UPDATE July 11 2014: She continues to do very well, exercise regularly, never was treated with immunomodulation therapy, she had right knee replacement October 2015, recovered very well. She complains of worsening bowel and bladder urgency,occasionally incontinence,  She complains of lack of stamina,  UPDATE June 19th 2017: She still has right knee pain despite previous right knee replacement,, also left knee pain, she still exercise regularly, water aerobic, which has been helpful, she does not drive on interstate, she has more right foot numbness, she does not feel confidence driving on the high way.  She still plays golf, she has six children, she will visit her son, she still lives in her house.  UPDATE September 04 2016: She is with her daughter at visit today, she complains of left hip pain, radiating pain to left leg since early August 2018, she was seen by orthopedic, was given a course of po steroid, which helps her some.  We have personally reviewed MRI seen 2015,MRI of the lumbar region showed multilevel significant degenerative changes, most severe at L5-S1, bulging disc and facet hypertrophy, moderate right, mild left foraminal narrowing,  potential impingement on the right L5 nerve roots,  She also complains of slow  worsening memory loss  UPDATE Nov 12 2018: She complains of increased fatigue, urinary incontinence, occasionally bowel incontinence, she was not able to for 6 months, now she is able to do water aerobic again, she also complains of mild memory loss, Personally reviewed MRIs in 2015, MRI of brain generalized atrophy, few supratentorium T2/FLAIR hyperdensity lesion, MRI of cervical spine, multilevel degenerative changes, no cord lesion.  MRI of thoracic spine subtle T2 hyperintensity at the T6-7 level, no acute plaque.  MRI of lumbar multilevel degenerative changes, moderate right foraminal stenosis at L5-S1, potentially impingement of the right L5 in the descending bilateral S1 nerve roots, moderate right foraminal stenosis at L4-5.  REVIEW OF SYSTEMS: Full 14 system review of systems performed and notable only for those listed, all others are neg:  As above  ALLERGIES: Allergies  Allergen Reactions  . Latex Rash    Band-aids     HOME MEDICATIONS: Outpatient Medications Prior to Visit  Medication Sig Dispense Refill  . Acetaminophen (TYLENOL ARTHRITIS PAIN PO) Take 2 tablets by mouth daily.     Marland Kitchen amLODipine (NORVASC) 2.5 MG tablet Take 2.5 mg by mouth daily.    . calcium carbonate (TUMS - DOSED IN MG ELEMENTAL CALCIUM) 500 MG chewable tablet Chew 2 tablets by mouth 2 (two) times daily.    . Glucosamine-Chondroitin (GLUCOSAMINE CHONDR COMPLEX PO) Take by mouth 2 (two) times daily.    . irbesartan (AVAPRO) 300 MG tablet Take 300 mg by mouth daily.  3  . Multiple Vitamins-Minerals (MULTIVITAMIN PO) Take 1 tablet by mouth daily.     Marland Kitchen MYRBETRIQ 50 MG TB24 tablet Take 50 mg by mouth daily.    . Omega-3 Fatty Acids (FISH OIL BURP-LESS PO) Take 2 capsules by mouth 2 (two) times daily.     . ranitidine (ZANTAC) 150 MG tablet Take 150 mg by mouth 2 (two) times daily.  4  . RESTASIS 0.05 % ophthalmic emulsion Place 1 drop into both eyes 2 (two) times daily.     Marland Kitchen VITAMIN D, CHOLECALCIFEROL, PO  Take 1,000 Units by mouth 2 (two) times daily.      No facility-administered medications prior to visit.     PAST MEDICAL HISTORY: Past Medical History:  Diagnosis Date  . Arthritis    "qwhere"  . Hypertension   . Memory loss    "from [place] & old age; I guess" (11/11/2013)  . MS (multiple sclerosis) ([place])   . Optic neuritis   . Urinary urgency   . Vertigo     PAST SURGICAL HISTORY: Past Surgical History:  Procedure Laterality Date  . BREAST BIOPSY Right 10/17/2017   fibrocystic changes  . CATARACT EXTRACTION W/ INTRAOCULAR LENS  IMPLANT, BILATERAL Bilateral 2013  . COLECTOMY  1980's   S/P colonoscopy  . JOINT REPLACEMENT    . KNEE ARTHROSCOPY Right 1994   "from staph infection"  . KNEE ARTHROSCOPY  2012   "meniscus repair"  . TONSILLECTOMY  ~ 1958  . TOTAL KNEE ARTHROPLASTY Right 11/10/2013  . TOTAL KNEE ARTHROPLASTY Right 11/10/2013   Procedure: RIGHT TOTAL KNEE ARTHROPLASTY;  Surgeon: Ninetta Lights, MD;  Location: [facility];  Service: Orthopedics;  Laterality: Right;  Marland Kitchen VAGINAL HYSTERECTOMY  1973    FAMILY HISTORY: Family History  Problem Relation Age of Onset  . Cancer Father   . Leukemia Mother   . Heart disease Other   . Heart disease  Maternal Grandmother   . Breast cancer Maternal Aunt     SOCIAL HISTORY: Social History   Socioeconomic History  . Marital status: Widowed    Spouse name: Not on file  . Number of children: 6  . Years of education: College  . Highest education level: Not on file  Occupational History    Comment: Retired  Scientific laboratory technician  . Financial resource strain: Not on file  . Food insecurity    Worry: Not on file    Inability: Not on file  . Transportation needs    Medical: Not on file    Non-medical: Not on file  Tobacco Use  . Smoking status: Never Smoker  . Smokeless tobacco: Never Used  Substance and Sexual Activity  . Alcohol use: Yes    Alcohol/week: 7.0 standard drinks    Types: 7 Glasses of wine per week  . Drug use:  No  . Sexual activity: Never  Lifestyle  . Physical activity    Days per week: Not on file    Minutes per session: Not on file  . Stress: Not on file  Relationships  . Social Herbalist on phone: Not on file    Gets together: Not on file    Attends religious service: Not on file    Active member of club or organization: Not on file    Attends meetings of clubs or organizations: Not on file    Relationship status: Not on file  . Intimate partner violence    Fear of current or ex partner: Not on file    Emotionally abused: Not on file    Physically abused: Not on file    Forced sexual activity: Not on file  Other Topics Concern  . Not on file  Social History Narrative   Patient is retired and has a Gaffer. She is a widow and has 6 children. She lives at the [place] at [facility].      PHYSICAL EXAM  Vitals:   11/12/18 1027  Height: 5\' 5"  (1.651 m)   Body mass index is 25.73 kg/m.  Generalized: Well developed, in no acute distress  Head: normocephalic and atraumatic,. Oropharynx benign  Neck: Supple,   Musculoskeletal: No deformity   Neurological examination   Mentation: Alert oriented to time, place, history taking. Attention span and concentration appropriate. Recent and remote memory intact.  Follows all commands speech and language fluent.   Cranial nerve II-XII: Pupils were equal round reactive to light extraocular movements were full, visual field were full on confrontational test. Facial sensation and strength were normal. hearing was intact to finger rubbing bilaterally. Uvula tongue midline. head turning and shoulder shrug were normal and symmetric.Tongue protrusion into cheek strength was normal. Motor: Mild bilateral lower extremity spasticity full strength in the BUE, BLE, Sensory: normal and symmetric to light touch, pinprick, and  Vibration, in the upper and lower extremities  Coordination: finger-nose-finger, heel-to-shin bilaterally, no  dysmetria Reflexes: Symmetric upper and lower plantar responses were flexor bilaterally. Gait and Station: Stand up by pushing on chair arm, bilateral valgus knee mildly unsteady gait.   DIAGNOSTIC DATA (LABS, IMAGING, TESTING) - I reviewed patient records, labs, notes, testing and imaging myself where available.  Lab Results  Component Value Date   WBC 9.3 11/12/2013   HGB 11.3 (L) 11/12/2013   HCT 34.5 (L) 11/12/2013   MCV 100.9 (H) 11/12/2013   PLT 251 11/12/2013      Component Value Date/Time  NA 140 11/12/2013 0544   K 4.5 11/12/2013 0544   CL 105 11/12/2013 0544   CO2 21 11/12/2013 0544   GLUCOSE 110 (H) 11/12/2013 0544   BUN 16 11/12/2013 0544   CREATININE 0.63 11/12/2013 0544   CALCIUM 8.8 11/12/2013 0544   PROT 7.3 11/02/2013 1149   ALBUMIN 4.1 11/02/2013 1149   AST 28 11/02/2013 1149   ALT 19 11/02/2013 1149   ALKPHOS 73 11/02/2013 1149   BILITOT 1.0 11/02/2013 1149   GFRNONAA 85 (L) 11/12/2013 0544   GFRAA >90 11/12/2013 0544    ASSESSMENT AND PLAN  84 y.o. year old female   Multiple sclerosis since 1980s,  History of optic neuritis, spinal cord involvement, gait abnormality,  Never received long-term immunomodulation therapy,   Gait abnormality  Combination of her thoracic cord MS lesion, lumbar degenerative changes, deconditioning  Continue water aerobics  Mild cognitive impairment  She has no significant involvement of brain by MS,  Laboratory evaluations to rule out treatable etiology  I have encouraged her to continue moderate exercise, only return to clinic for new issues.     Marcial Pacas, M.D. Ph.D.  [facility] [place], [place] [address] Phone: [phone] Fax:      [phone]

## 2018-11-13 LAB — CBC WITH DIFFERENTIAL
Basophils Absolute: 0 10*3/uL (ref 0.0–0.2)
Basos: 1 %
EOS (ABSOLUTE): 0.1 10*3/uL (ref 0.0–0.4)
Eos: 1 %
Hematocrit: 42.1 % (ref 34.0–46.6)
Hemoglobin: 14.1 g/dL (ref 11.1–15.9)
Immature Grans (Abs): 0 10*3/uL (ref 0.0–0.1)
Immature Granulocytes: 0 %
Lymphocytes Absolute: 1.6 10*3/uL (ref 0.7–3.1)
Lymphs: 26 %
MCH: 32.3 pg (ref 26.6–33.0)
MCHC: 33.5 g/dL (ref 31.5–35.7)
MCV: 97 fL (ref 79–97)
Monocytes Absolute: 1 10*3/uL — ABNORMAL HIGH (ref 0.1–0.9)
Monocytes: 17 %
Neutrophils Absolute: 3.4 10*3/uL (ref 1.4–7.0)
Neutrophils: 55 %
RBC: 4.36 x10E6/uL (ref 3.77–5.28)
RDW: 12.2 % (ref 11.7–15.4)
WBC: 6.1 10*3/uL (ref 3.4–10.8)

## 2018-11-13 LAB — COMPREHENSIVE METABOLIC PANEL
ALT: 18 IU/L (ref 0–32)
AST: 25 IU/L (ref 0–40)
Albumin/Globulin Ratio: 1.8 (ref 1.2–2.2)
Albumin: 4.3 g/dL (ref 3.6–4.6)
Alkaline Phosphatase: 80 IU/L (ref 39–117)
BUN/Creatinine Ratio: 28 (ref 12–28)
BUN: 22 mg/dL (ref 8–27)
Bilirubin Total: 0.8 mg/dL (ref 0.0–1.2)
CO2: 20 mmol/L (ref 20–29)
Calcium: 9.8 mg/dL (ref 8.7–10.3)
Chloride: 102 mmol/L (ref 96–106)
Creatinine, Ser: 0.78 mg/dL (ref 0.57–1.00)
GFR calc Af Amer: 82 mL/min/{1.73_m2} (ref >59)
GFR calc non Af Amer: 72 mL/min/{1.73_m2} (ref >59)
Globulin, Total: 2.4 g/dL (ref 1.5–4.5)
Glucose: 136 mg/dL — ABNORMAL HIGH (ref 65–99)
Potassium: 4.5 mmol/L (ref 3.5–5.2)
Sodium: 138 mmol/L (ref 134–144)
Total Protein: 6.7 g/dL (ref 6.0–8.5)

## 2018-11-13 LAB — VITAMIN B12: Vitamin B-12: 781 pg/mL (ref 232–1245)

## 2018-11-13 LAB — TSH: TSH: 0.663 u[IU]/mL (ref 0.450–4.500)

## 2018-11-16 ENCOUNTER — Telehealth: Payer: Self-pay | Admitting: Neurology

## 2018-11-16 NOTE — Telephone Encounter (Signed)
Laboratory evaluation showed normal CBC, CMP, elevated glucose 136, TSH, B12.

## 2018-11-16 NOTE — Telephone Encounter (Signed)
I spoke to the patient and she verbalized understanding of the results below. °

## 2018-11-19 DIAGNOSIS — R350 Frequency of micturition: Secondary | ICD-10-CM | POA: Diagnosis not present

## 2018-11-20 DIAGNOSIS — R21 Rash and other nonspecific skin eruption: Secondary | ICD-10-CM | POA: Diagnosis not present

## 2018-11-24 DIAGNOSIS — L249 Irritant contact dermatitis, unspecified cause: Secondary | ICD-10-CM | POA: Diagnosis not present

## 2018-11-24 DIAGNOSIS — L298 Other pruritus: Secondary | ICD-10-CM | POA: Diagnosis not present

## 2018-12-03 DIAGNOSIS — Z1212 Encounter for screening for malignant neoplasm of rectum: Secondary | ICD-10-CM | POA: Diagnosis not present

## 2018-12-03 DIAGNOSIS — Z01419 Encounter for gynecological examination (general) (routine) without abnormal findings: Secondary | ICD-10-CM | POA: Diagnosis not present

## 2018-12-03 DIAGNOSIS — I10 Essential (primary) hypertension: Secondary | ICD-10-CM | POA: Diagnosis not present

## 2018-12-09 DIAGNOSIS — H18413 Arcus senilis, bilateral: Secondary | ICD-10-CM | POA: Diagnosis not present

## 2018-12-09 DIAGNOSIS — H16223 Keratoconjunctivitis sicca, not specified as Sjogren's, bilateral: Secondary | ICD-10-CM | POA: Diagnosis not present

## 2018-12-09 DIAGNOSIS — Z961 Presence of intraocular lens: Secondary | ICD-10-CM | POA: Diagnosis not present

## 2018-12-09 DIAGNOSIS — I10 Essential (primary) hypertension: Secondary | ICD-10-CM | POA: Diagnosis not present

## 2018-12-24 DIAGNOSIS — N3941 Urge incontinence: Secondary | ICD-10-CM | POA: Diagnosis not present

## 2019-03-04 DIAGNOSIS — N3941 Urge incontinence: Secondary | ICD-10-CM | POA: Diagnosis not present

## 2019-03-04 DIAGNOSIS — R350 Frequency of micturition: Secondary | ICD-10-CM | POA: Diagnosis not present

## 2019-04-01 DIAGNOSIS — N3941 Urge incontinence: Secondary | ICD-10-CM | POA: Diagnosis not present

## 2019-04-26 DIAGNOSIS — L218 Other seborrheic dermatitis: Secondary | ICD-10-CM | POA: Diagnosis not present

## 2019-04-26 DIAGNOSIS — L509 Urticaria, unspecified: Secondary | ICD-10-CM | POA: Diagnosis not present

## 2019-04-29 DIAGNOSIS — N3941 Urge incontinence: Secondary | ICD-10-CM | POA: Diagnosis not present

## 2019-04-29 DIAGNOSIS — R350 Frequency of micturition: Secondary | ICD-10-CM | POA: Diagnosis not present

## 2019-05-17 DIAGNOSIS — L218 Other seborrheic dermatitis: Secondary | ICD-10-CM | POA: Diagnosis not present

## 2019-05-17 DIAGNOSIS — L509 Urticaria, unspecified: Secondary | ICD-10-CM | POA: Diagnosis not present

## 2019-05-27 DIAGNOSIS — R350 Frequency of micturition: Secondary | ICD-10-CM | POA: Diagnosis not present

## 2019-05-27 DIAGNOSIS — N3941 Urge incontinence: Secondary | ICD-10-CM | POA: Diagnosis not present

## 2019-06-07 DIAGNOSIS — I10 Essential (primary) hypertension: Secondary | ICD-10-CM | POA: Diagnosis not present

## 2019-06-07 DIAGNOSIS — E7800 Pure hypercholesterolemia, unspecified: Secondary | ICD-10-CM | POA: Diagnosis not present

## 2019-06-07 DIAGNOSIS — N390 Urinary tract infection, site not specified: Secondary | ICD-10-CM | POA: Diagnosis not present

## 2019-06-10 DIAGNOSIS — E7800 Pure hypercholesterolemia, unspecified: Secondary | ICD-10-CM | POA: Diagnosis not present

## 2019-06-10 DIAGNOSIS — M8580 Other specified disorders of bone density and structure, unspecified site: Secondary | ICD-10-CM | POA: Diagnosis not present

## 2019-06-10 DIAGNOSIS — M4120 Other idiopathic scoliosis, site unspecified: Secondary | ICD-10-CM | POA: Diagnosis not present

## 2019-06-10 DIAGNOSIS — K219 Gastro-esophageal reflux disease without esophagitis: Secondary | ICD-10-CM | POA: Diagnosis not present

## 2019-06-10 DIAGNOSIS — R32 Unspecified urinary incontinence: Secondary | ICD-10-CM | POA: Diagnosis not present

## 2019-06-10 DIAGNOSIS — I10 Essential (primary) hypertension: Secondary | ICD-10-CM | POA: Diagnosis not present

## 2019-06-10 DIAGNOSIS — L409 Psoriasis, unspecified: Secondary | ICD-10-CM | POA: Diagnosis not present

## 2019-06-10 DIAGNOSIS — G35 Multiple sclerosis: Secondary | ICD-10-CM | POA: Diagnosis not present

## 2019-06-10 DIAGNOSIS — G3184 Mild cognitive impairment, so stated: Secondary | ICD-10-CM | POA: Diagnosis not present

## 2019-06-10 DIAGNOSIS — Z0000 Encounter for general adult medical examination without abnormal findings: Secondary | ICD-10-CM | POA: Diagnosis not present

## 2019-06-22 DIAGNOSIS — L218 Other seborrheic dermatitis: Secondary | ICD-10-CM | POA: Diagnosis not present

## 2019-06-22 DIAGNOSIS — L509 Urticaria, unspecified: Secondary | ICD-10-CM | POA: Diagnosis not present

## 2019-07-01 DIAGNOSIS — N3941 Urge incontinence: Secondary | ICD-10-CM | POA: Diagnosis not present

## 2019-07-01 DIAGNOSIS — R350 Frequency of micturition: Secondary | ICD-10-CM | POA: Diagnosis not present

## 2019-09-02 DIAGNOSIS — N3941 Urge incontinence: Secondary | ICD-10-CM | POA: Diagnosis not present

## 2019-10-07 DIAGNOSIS — N3941 Urge incontinence: Secondary | ICD-10-CM | POA: Diagnosis not present

## 2019-10-07 DIAGNOSIS — R350 Frequency of micturition: Secondary | ICD-10-CM | POA: Diagnosis not present

## 2019-10-21 DIAGNOSIS — M8589 Other specified disorders of bone density and structure, multiple sites: Secondary | ICD-10-CM | POA: Diagnosis not present

## 2019-10-29 DIAGNOSIS — I10 Essential (primary) hypertension: Secondary | ICD-10-CM | POA: Diagnosis not present

## 2019-10-29 DIAGNOSIS — M810 Age-related osteoporosis without current pathological fracture: Secondary | ICD-10-CM | POA: Diagnosis not present

## 2019-11-04 ENCOUNTER — Other Ambulatory Visit: Payer: Self-pay | Admitting: Internal Medicine

## 2019-11-04 DIAGNOSIS — I10 Essential (primary) hypertension: Secondary | ICD-10-CM | POA: Diagnosis not present

## 2019-11-04 DIAGNOSIS — R413 Other amnesia: Secondary | ICD-10-CM | POA: Diagnosis not present

## 2019-11-04 DIAGNOSIS — G35 Multiple sclerosis: Secondary | ICD-10-CM | POA: Diagnosis not present

## 2019-11-04 DIAGNOSIS — E042 Nontoxic multinodular goiter: Secondary | ICD-10-CM | POA: Diagnosis not present

## 2019-11-11 DIAGNOSIS — N3941 Urge incontinence: Secondary | ICD-10-CM | POA: Diagnosis not present

## 2019-11-11 DIAGNOSIS — R350 Frequency of micturition: Secondary | ICD-10-CM | POA: Diagnosis not present

## 2019-11-23 DIAGNOSIS — R1110 Vomiting, unspecified: Secondary | ICD-10-CM | POA: Diagnosis not present

## 2019-11-23 DIAGNOSIS — R634 Abnormal weight loss: Secondary | ICD-10-CM | POA: Diagnosis not present

## 2019-12-02 ENCOUNTER — Ambulatory Visit
Admission: RE | Admit: 2019-12-02 | Discharge: 2019-12-02 | Disposition: A | Discharge status: Home / Self Care | Payer: Medicare Other | Source: Ambulatory Visit | Attending: Internal Medicine | Admitting: Internal Medicine

## 2019-12-02 ENCOUNTER — Other Ambulatory Visit: Payer: Self-pay

## 2019-12-02 DIAGNOSIS — G35 Multiple sclerosis: Secondary | ICD-10-CM | POA: Diagnosis not present

## 2019-12-02 DIAGNOSIS — G9389 Other specified disorders of brain: Secondary | ICD-10-CM | POA: Diagnosis not present

## 2019-12-02 DIAGNOSIS — J3489 Other specified disorders of nose and nasal sinuses: Secondary | ICD-10-CM | POA: Diagnosis not present

## 2019-12-02 DIAGNOSIS — Z23 Encounter for immunization: Secondary | ICD-10-CM | POA: Diagnosis not present

## 2019-12-02 DIAGNOSIS — J320 Chronic maxillary sinusitis: Secondary | ICD-10-CM | POA: Diagnosis not present

## 2019-12-07 ENCOUNTER — Ambulatory Visit (INDEPENDENT_AMBULATORY_CARE_PROVIDER_SITE_OTHER): Payer: Medicare Other | Admitting: Neurology

## 2019-12-07 ENCOUNTER — Encounter: Payer: Self-pay | Admitting: Neurology

## 2019-12-07 VITALS — BP 117/70 | HR 87 | Ht 65.0 in | Wt 144.5 lb

## 2019-12-07 DIAGNOSIS — R269 Unspecified abnormalities of gait and mobility: Secondary | ICD-10-CM | POA: Diagnosis not present

## 2019-12-07 DIAGNOSIS — N3941 Urge incontinence: Secondary | ICD-10-CM | POA: Diagnosis not present

## 2019-12-07 DIAGNOSIS — F0390 Unspecified dementia without behavioral disturbance: Secondary | ICD-10-CM

## 2019-12-07 DIAGNOSIS — G35 Multiple sclerosis: Secondary | ICD-10-CM | POA: Diagnosis not present

## 2019-12-07 DIAGNOSIS — R350 Frequency of micturition: Secondary | ICD-10-CM | POA: Diagnosis not present

## 2019-12-07 NOTE — Progress Notes (Addendum)
[facility]  PATIENT: Jennifer Atkins DOB: [birth year]   HISTORY OF PRESENT ILLNESS: Jennifer Atkins is a 85 year old right-handed Caucasian female, followup for multiple sclerosis, her primary care physician is [facility] Dr. Shelia Media, last clinical visit was with Hoyle Sauer in May 2014  She had past medical history of relapsing remitting multiple sclerosis, was patient of Dr. Erling Cruz for many years, she had gait abnormality, she had right optic neuritis with symptoms beginning in 1980, with total recovery within one month after po steroid treatment, recurrent right optic neuritis again a year later.    Over the years, she has developed gradual onset gait difficulty, especially since 2014, also fatigue, mild memory loss,   She has never been on any immunomodulating medications.   She was highly functional, driving, water aerobic, golf regularly, she has urinary urgency, nocturia, taking vesicare, occasionally bladder incontinence when she gets up from a seated position  She fell in Jan 2015, went to ED, had left forehead laceration, previously, she fell in August 2014 at [facility]    She was referred to physical therapy afterwards, machine exercise has made her right leg worse, right foot pain.  She now complains of worsening right foot numbness, right knee pain, has right meniscus repair in the past, right leg numbness, weakness, she also has mild left foot numbness.   UPDATE July 01 2013: She fell again on June 10th 2015, without warning signs, now with right thumb pain, now she has right foot numbness, multiple falling episodes over past one year. We have reviewed MRI of the brain, mild atrophy, scattered periventricular white matter disease, small vessel disease vs. MS lesions,  MRI thoracic spine,Subtle T2 hyperintensity at T6-7 level, may represent chronic demyelinating plaque. No acute plaques are seen.  MRI lumbar spine (without)  demonstrating multilevel degenerative disc disease, most severe at L5-S1, disc bulging and facet hypertrophy with moderate right and mild left foraminal stenosis; potential impingement upon the right L5 and descending bilateral S1 roots. L3-4: disc bulging and facet hypertrophy with mild right and moderate left foraminal stenosis.  L4-5: disc bulging and facet hypertrophy with moderate right and mild left foraminal stenosis   MRI cervical:C4-5: disc bulging with mild biforaminal stenosis. C5-6. C6-7: disc bulging and uncovertebral joint hypertrophy with mild biforaminal stenosis. No intrinsic or compressive spinal cord lesions. No abnormal enhancing lesions.  UPDATE July 11 2014: She continues to do very well, exercise regularly, never was treated with immunomodulation therapy, she had right knee replacement October 2015, recovered very well. She complains of worsening bowel and bladder urgency,occasionally incontinence,  She complains of lack of stamina,  UPDATE June 19th 2017: She still has right knee pain despite previous right knee replacement,, also left knee pain, she still exercise regularly, water aerobic, which has been helpful, she [place] not [place], she has more right foot numbness, she does not feel confidence driving on the high way.  She still plays golf, she has six children, she will visit her son, she still lives in her house.  UPDATE September 04 2016: She is with her daughter at visit today, she complains of left hip pain, radiating pain to left leg since early August 2018, she was seen by orthopedic, was given a course of po steroid, which helps her some.  We have personally reviewed MRI seen 2015,MRI of the lumbar region showed multilevel significant degenerative changes, most severe at L5-S1, bulging disc and facet hypertrophy, moderate right, mild left foraminal narrowing, potential  impingement on the right L5 nerve roots,  She also complains of slow worsening  memory loss  UPDATE Nov 12 2018: She complains of increased fatigue, urinary incontinence, occasionally bowel incontinence, she was not able to for 6 months, now she is able to do water aerobic again, she also complains of mild memory loss, Personally reviewed MRIs in 2015, MRI of brain generalized atrophy, few supratentorium T2/FLAIR hyperdensity lesion, MRI of cervical spine, multilevel degenerative changes, no cord lesion.  MRI of thoracic spine subtle T2 hyperintensity at the T6-7 level, no acute plaque.  MRI of lumbar multilevel degenerative changes, moderate right foraminal stenosis at L5-S1, potentially impingement of the right L5 in the descending bilateral S1 nerve roots, moderate right foraminal stenosis at L4-5.  UPDATE Dec 07 2019: She moved to independent living since July 2021, because of worsening functional status, become more forgetful, difficult to manage her own medication, slow worsening gait abnormality, she continue to exercise regularly, sleeps well.  She is accompanied by her 2 daughters at today's clinical visit, who is more concerned about her mother's processing speed, difficulty managing her medications, denies significant difficulties. MoCA examination 17 out of 30 today  We also personally reviewed MRI of the brain with and without contrast in November 2021: No acute abnormality, mild to moderate generalized atrophy, supratentorium small vessel disease, mild progression compared to previous MRI in 2015  Laboratory evaluations in May 2021: Normal CBC hemoglobin of 14.3, CMP, triglyceride 168, LDL 143, REVIEW OF SYSTEMS: Full 14 system review of systems performed and notable only for those listed, all others are neg:  As above  ALLERGIES: Allergies  Allergen Reactions  . Latex Rash    Band-aids     HOME MEDICATIONS: Outpatient Medications Prior to Visit  Medication Sig Dispense Refill  . Acetaminophen (TYLENOL ARTHRITIS PAIN PO) Take 2 tablets by mouth daily.      Marland Kitchen amLODipine (NORVASC) 2.5 MG tablet Take 2.5 mg by mouth daily.    . calcium carbonate (TUMS - DOSED IN MG ELEMENTAL CALCIUM) 500 MG chewable tablet Chew 2 tablets by mouth 2 (two) times daily.    . Glucosamine-Chondroitin (GLUCOSAMINE CHONDR COMPLEX PO) Take by mouth 2 (two) times daily.    . Multiple Vitamins-Minerals (MULTIVITAMIN PO) Take 1 tablet by mouth daily.     . Omega-3 Fatty Acids (FISH OIL BURP-LESS PO) Take 1 capsule by mouth daily.     Marland Kitchen omeprazole (PRILOSEC) 20 MG capsule Take 20 mg by mouth daily.    . RESTASIS 0.05 % ophthalmic emulsion Place 1 drop into both eyes 2 (two) times daily.     . Trospium Chloride 60 MG CP24 Take 1 capsule by mouth daily.    . valsartan (DIOVAN) 320 MG tablet Take 320 mg by mouth daily.    Marland Kitchen VITAMIN D, CHOLECALCIFEROL, PO Take 1,000 Units by mouth 2 (two) times daily.     Marland Kitchen FAMOTIDINE PO Take 1 tablet by mouth daily.     No facility-administered medications prior to visit.    PAST MEDICAL HISTORY: Past Medical History:  Diagnosis Date  . Arthritis    "qwhere"  . Hypertension   . Memory loss    "from [place] & old age; I guess" (11/11/2013)  . MS (multiple sclerosis) ([place])   . Optic neuritis   . Urinary urgency   . Vertigo     PAST SURGICAL HISTORY: Past Surgical History:  Procedure Laterality Date  . BREAST BIOPSY Right 10/17/2017   fibrocystic changes  .  CATARACT EXTRACTION W/ INTRAOCULAR LENS  IMPLANT, BILATERAL Bilateral 2013  . COLECTOMY  1980's   S/P colonoscopy  . JOINT REPLACEMENT    . KNEE ARTHROSCOPY Right 1994   "from staph infection"  . KNEE ARTHROSCOPY  2012   "meniscus repair"  . TONSILLECTOMY  ~ 1958  . TOTAL KNEE ARTHROPLASTY Right 11/10/2013  . TOTAL KNEE ARTHROPLASTY Right 11/10/2013   Procedure: RIGHT TOTAL KNEE ARTHROPLASTY;  Surgeon: Ninetta Lights, MD;  Location: [facility];  Service: Orthopedics;  Laterality: Right;  Marland Kitchen VAGINAL HYSTERECTOMY  1973    FAMILY HISTORY: Family History  Problem Relation Age of  Onset  . Cancer Father   . Leukemia Mother   . Heart disease Other   . Heart disease Maternal Grandmother   . Breast cancer Maternal Aunt     SOCIAL HISTORY: Social History   Socioeconomic History  . Marital status: Widowed    Spouse name: Not on file  . Number of children: 6  . Years of education: College  . Highest education level: Not on file  Occupational History    Comment: Retired  Tobacco Use  . Smoking status: Never Smoker  . Smokeless tobacco: Never Used  Substance and Sexual Activity  . Alcohol use: Yes    Alcohol/week: 7.0 standard drinks    Types: 7 Glasses of wine per week  . Drug use: No  . Sexual activity: Never  Other Topics Concern  . Not on file  Social History Narrative   Patient lives at [place], [place].   Right handed   Drinks tea in the morning, wine at night.   Social Determinants of Health   Financial Resource Strain:   . Difficulty of Paying Living Expenses: Not on file  Food Insecurity:   . Worried About Charity fundraiser in the Last Year: Not on file  . Ran Out of Food in the Last Year: Not on file  Transportation Needs:   . Lack of Transportation (Medical): Not on file  . Lack of Transportation (Non-Medical): Not on file  Physical Activity:   . Days of Exercise per Week: Not on file  . Minutes of Exercise per Session: Not on file  Stress:   . Feeling of Stress : Not on file  Social Connections:   . Frequency of Communication with Friends and Family: Not on file  . Frequency of Social Gatherings with Friends and Family: Not on file  . Attends Religious Services: Not on file  . Active Member of Clubs or Organizations: Not on file  . Attends Archivist Meetings: Not on file  . Marital Status: Not on file  Intimate Partner Violence:   . Fear of Current or Ex-Partner: Not on file  . Emotionally Abused: Not on file  . Physically Abused: Not on file  . Sexually Abused: Not on file     PHYSICAL  EXAM  Vitals:   12/07/19 1103  BP: 117/70  Pulse: 87  Weight: 144 lb 8 oz (65.5 kg)  Height: 5\' 5"  (1.651 m)   Body mass index is 24.05 kg/m.  Generalized: Well developed, in no acute distress  Head: normocephalic and atraumatic,. Oropharynx benign  Neck: Supple,   Musculoskeletal: No deformity   Neurological examination   Montreal Cognitive Assessment  12/07/2019  Visuospatial/ Executive (0/5) 2  Naming (0/3) 3  Attention: Read list of digits (0/2) 2  Attention: Read list of letters (0/1) 1  Attention: Serial 7 subtraction starting at 100 (0/3)  0  Language: Repeat phrase (0/2) 2  Language : Fluency (0/1) 0  Abstraction (0/2) 1  Delayed Recall (0/5) 0  Orientation (0/6) 6  Total 17     CRANIAL NERVES: CN II: Visual fields are full to confrontation.  Pupils are round equal and briskly reactive to light. CN III, IV, VI: extraocular movement are normal. No ptosis. CN V: Facial sensation is intact to light touch. CN VII: Face is symmetric with normal eye closure and smile. CN VIII: Hearing is normal to casual conversation CN IX, X: Palate elevates symmetrically. Phonation is normal. CN XI: Head turning and shoulder shrug are intact CN XII: Tongue is midline with normal movements and no atrophy.  MOTOR: Muscle bulk and tone are normal. Muscle strength is normal.  REFLEXES: Reflexes are 2  and symmetric at the biceps, triceps, knees and ankles. Plantar responses are flexor.  SENSORY: Intact to light touch, pinprick, positional and vibratory sensation at fingers and toes.  COORDINATION: There is no trunk or limb ataxia.    GAIT/STANCE: She needs to push-up to get up from seated position, scoliosis, mildly unsteady  DIAGNOSTIC DATA (LABS, IMAGING, TESTING) - I reviewed patient records, labs, notes, testing and imaging myself where available.  Lab Results  Component Value Date   WBC 6.1 11/12/2018   HGB 14.1 11/12/2018   HCT 42.1 11/12/2018   MCV 97  11/12/2018   PLT 251 11/12/2013      Component Value Date/Time   NA 138 11/12/2018 1130   K 4.5 11/12/2018 1130   CL 102 11/12/2018 1130   CO2 20 11/12/2018 1130   GLUCOSE 136 (H) 11/12/2018 1130   GLUCOSE 110 (H) 11/12/2013 0544   BUN 22 11/12/2018 1130   CREATININE 0.78 11/12/2018 1130   CALCIUM 9.8 11/12/2018 1130   PROT 6.7 11/12/2018 1130   ALBUMIN 4.3 11/12/2018 1130   AST 25 11/12/2018 1130   ALT 18 11/12/2018 1130   ALKPHOS 80 11/12/2018 1130   BILITOT 0.8 11/12/2018 1130   GFRNONAA 72 11/12/2018 1130   GFRAA 82 11/12/2018 1130    ASSESSMENT AND PLAN  85 y.o. year old female   Multiple sclerosis since 1980s,  History of optic neuritis, spinal cord involvement, gait abnormality,  Never received long-term immunomodulation therapy,  There was no significant flareup over the years,   Gait abnormality  Combination of her MRI proven thoracic cord MS lesion, lumbar degenerative changes, deconditioning, aging  Continue water aerobics  Dementia  She has no significant involvement of brain from MS based on previous MRI of brain in 2015,  Laboratory evaluation showed no treatable etiology,  Slow worsening memory loss, Mini-Mental status 17 out of 30 today, most consistent with central nervous system degenerative disorder  Continue moderate exercise,   Marcial Pacas, M.D. Ph.D.  [facility] [place], [place] [address] Phone: [phone] Fax:      [phone]

## 2019-12-08 ENCOUNTER — Encounter: Payer: Self-pay | Admitting: Neurology

## 2019-12-08 DIAGNOSIS — R1111 Vomiting without nausea: Secondary | ICD-10-CM | POA: Diagnosis not present

## 2019-12-08 DIAGNOSIS — R634 Abnormal weight loss: Secondary | ICD-10-CM | POA: Diagnosis not present

## 2019-12-08 DIAGNOSIS — K219 Gastro-esophageal reflux disease without esophagitis: Secondary | ICD-10-CM | POA: Diagnosis not present

## 2020-01-04 DIAGNOSIS — R350 Frequency of micturition: Secondary | ICD-10-CM | POA: Diagnosis not present

## 2020-01-25 DIAGNOSIS — N3941 Urge incontinence: Secondary | ICD-10-CM | POA: Diagnosis not present

## 2020-01-25 DIAGNOSIS — R351 Nocturia: Secondary | ICD-10-CM | POA: Diagnosis not present

## 2020-02-01 DIAGNOSIS — N3941 Urge incontinence: Secondary | ICD-10-CM | POA: Diagnosis not present

## 2020-02-01 DIAGNOSIS — R350 Frequency of micturition: Secondary | ICD-10-CM | POA: Diagnosis not present

## 2020-02-25 DIAGNOSIS — G35 Multiple sclerosis: Secondary | ICD-10-CM | POA: Diagnosis not present

## 2020-02-25 DIAGNOSIS — M1712 Unilateral primary osteoarthritis, left knee: Secondary | ICD-10-CM | POA: Diagnosis not present

## 2020-02-25 DIAGNOSIS — M25562 Pain in left knee: Secondary | ICD-10-CM | POA: Diagnosis not present

## 2020-02-25 DIAGNOSIS — R2689 Other abnormalities of gait and mobility: Secondary | ICD-10-CM | POA: Diagnosis not present

## 2020-02-25 DIAGNOSIS — M6281 Muscle weakness (generalized): Secondary | ICD-10-CM | POA: Diagnosis not present

## 2020-02-29 DIAGNOSIS — R350 Frequency of micturition: Secondary | ICD-10-CM | POA: Diagnosis not present

## 2020-02-29 DIAGNOSIS — N3941 Urge incontinence: Secondary | ICD-10-CM | POA: Diagnosis not present

## 2020-03-01 DIAGNOSIS — M1712 Unilateral primary osteoarthritis, left knee: Secondary | ICD-10-CM | POA: Diagnosis not present

## 2020-03-01 DIAGNOSIS — M6281 Muscle weakness (generalized): Secondary | ICD-10-CM | POA: Diagnosis not present

## 2020-03-01 DIAGNOSIS — G35 Multiple sclerosis: Secondary | ICD-10-CM | POA: Diagnosis not present

## 2020-03-01 DIAGNOSIS — R2689 Other abnormalities of gait and mobility: Secondary | ICD-10-CM | POA: Diagnosis not present

## 2020-03-01 DIAGNOSIS — M25562 Pain in left knee: Secondary | ICD-10-CM | POA: Diagnosis not present

## 2020-03-02 DIAGNOSIS — M25562 Pain in left knee: Secondary | ICD-10-CM | POA: Diagnosis not present

## 2020-03-03 DIAGNOSIS — R2689 Other abnormalities of gait and mobility: Secondary | ICD-10-CM | POA: Diagnosis not present

## 2020-03-03 DIAGNOSIS — M1712 Unilateral primary osteoarthritis, left knee: Secondary | ICD-10-CM | POA: Diagnosis not present

## 2020-03-03 DIAGNOSIS — G35 Multiple sclerosis: Secondary | ICD-10-CM | POA: Diagnosis not present

## 2020-03-03 DIAGNOSIS — M25562 Pain in left knee: Secondary | ICD-10-CM | POA: Diagnosis not present

## 2020-03-03 DIAGNOSIS — M6281 Muscle weakness (generalized): Secondary | ICD-10-CM | POA: Diagnosis not present

## 2020-03-07 DIAGNOSIS — M6281 Muscle weakness (generalized): Secondary | ICD-10-CM | POA: Diagnosis not present

## 2020-03-07 DIAGNOSIS — M1712 Unilateral primary osteoarthritis, left knee: Secondary | ICD-10-CM | POA: Diagnosis not present

## 2020-03-07 DIAGNOSIS — R2689 Other abnormalities of gait and mobility: Secondary | ICD-10-CM | POA: Diagnosis not present

## 2020-03-07 DIAGNOSIS — M25562 Pain in left knee: Secondary | ICD-10-CM | POA: Diagnosis not present

## 2020-03-07 DIAGNOSIS — G35 Multiple sclerosis: Secondary | ICD-10-CM | POA: Diagnosis not present

## 2020-03-14 DIAGNOSIS — M1712 Unilateral primary osteoarthritis, left knee: Secondary | ICD-10-CM | POA: Diagnosis not present

## 2020-03-14 DIAGNOSIS — M25562 Pain in left knee: Secondary | ICD-10-CM | POA: Diagnosis not present

## 2020-03-14 DIAGNOSIS — R2689 Other abnormalities of gait and mobility: Secondary | ICD-10-CM | POA: Diagnosis not present

## 2020-03-14 DIAGNOSIS — G35 Multiple sclerosis: Secondary | ICD-10-CM | POA: Diagnosis not present

## 2020-03-14 DIAGNOSIS — M6281 Muscle weakness (generalized): Secondary | ICD-10-CM | POA: Diagnosis not present

## 2020-03-15 DIAGNOSIS — M1712 Unilateral primary osteoarthritis, left knee: Secondary | ICD-10-CM | POA: Diagnosis not present

## 2020-03-15 DIAGNOSIS — M6281 Muscle weakness (generalized): Secondary | ICD-10-CM | POA: Diagnosis not present

## 2020-03-15 DIAGNOSIS — G35 Multiple sclerosis: Secondary | ICD-10-CM | POA: Diagnosis not present

## 2020-03-15 DIAGNOSIS — R2689 Other abnormalities of gait and mobility: Secondary | ICD-10-CM | POA: Diagnosis not present

## 2020-03-15 DIAGNOSIS — M25562 Pain in left knee: Secondary | ICD-10-CM | POA: Diagnosis not present

## 2020-03-17 DIAGNOSIS — M6281 Muscle weakness (generalized): Secondary | ICD-10-CM | POA: Diagnosis not present

## 2020-03-17 DIAGNOSIS — M1712 Unilateral primary osteoarthritis, left knee: Secondary | ICD-10-CM | POA: Diagnosis not present

## 2020-03-17 DIAGNOSIS — G35 Multiple sclerosis: Secondary | ICD-10-CM | POA: Diagnosis not present

## 2020-03-17 DIAGNOSIS — R2689 Other abnormalities of gait and mobility: Secondary | ICD-10-CM | POA: Diagnosis not present

## 2020-03-17 DIAGNOSIS — M25562 Pain in left knee: Secondary | ICD-10-CM | POA: Diagnosis not present

## 2020-03-21 DIAGNOSIS — G35 Multiple sclerosis: Secondary | ICD-10-CM | POA: Diagnosis not present

## 2020-03-21 DIAGNOSIS — M1712 Unilateral primary osteoarthritis, left knee: Secondary | ICD-10-CM | POA: Diagnosis not present

## 2020-03-21 DIAGNOSIS — M25562 Pain in left knee: Secondary | ICD-10-CM | POA: Diagnosis not present

## 2020-03-21 DIAGNOSIS — R2689 Other abnormalities of gait and mobility: Secondary | ICD-10-CM | POA: Diagnosis not present

## 2020-03-21 DIAGNOSIS — M6281 Muscle weakness (generalized): Secondary | ICD-10-CM | POA: Diagnosis not present

## 2020-03-22 DIAGNOSIS — M25552 Pain in left hip: Secondary | ICD-10-CM | POA: Diagnosis not present

## 2020-03-23 DIAGNOSIS — M25562 Pain in left knee: Secondary | ICD-10-CM | POA: Diagnosis not present

## 2020-03-23 DIAGNOSIS — G35 Multiple sclerosis: Secondary | ICD-10-CM | POA: Diagnosis not present

## 2020-03-23 DIAGNOSIS — R2689 Other abnormalities of gait and mobility: Secondary | ICD-10-CM | POA: Diagnosis not present

## 2020-03-23 DIAGNOSIS — M6281 Muscle weakness (generalized): Secondary | ICD-10-CM | POA: Diagnosis not present

## 2020-03-23 DIAGNOSIS — M1712 Unilateral primary osteoarthritis, left knee: Secondary | ICD-10-CM | POA: Diagnosis not present

## 2020-03-24 DIAGNOSIS — M6281 Muscle weakness (generalized): Secondary | ICD-10-CM | POA: Diagnosis not present

## 2020-03-24 DIAGNOSIS — R2689 Other abnormalities of gait and mobility: Secondary | ICD-10-CM | POA: Diagnosis not present

## 2020-03-24 DIAGNOSIS — M25562 Pain in left knee: Secondary | ICD-10-CM | POA: Diagnosis not present

## 2020-03-24 DIAGNOSIS — G35 Multiple sclerosis: Secondary | ICD-10-CM | POA: Diagnosis not present

## 2020-03-24 DIAGNOSIS — M1712 Unilateral primary osteoarthritis, left knee: Secondary | ICD-10-CM | POA: Diagnosis not present

## 2020-03-28 DIAGNOSIS — R350 Frequency of micturition: Secondary | ICD-10-CM | POA: Diagnosis not present

## 2020-03-28 DIAGNOSIS — M6281 Muscle weakness (generalized): Secondary | ICD-10-CM | POA: Diagnosis not present

## 2020-03-28 DIAGNOSIS — G35 Multiple sclerosis: Secondary | ICD-10-CM | POA: Diagnosis not present

## 2020-03-28 DIAGNOSIS — M25562 Pain in left knee: Secondary | ICD-10-CM | POA: Diagnosis not present

## 2020-03-28 DIAGNOSIS — R2689 Other abnormalities of gait and mobility: Secondary | ICD-10-CM | POA: Diagnosis not present

## 2020-03-28 DIAGNOSIS — N3941 Urge incontinence: Secondary | ICD-10-CM | POA: Diagnosis not present

## 2020-03-28 DIAGNOSIS — M1712 Unilateral primary osteoarthritis, left knee: Secondary | ICD-10-CM | POA: Diagnosis not present

## 2020-03-30 DIAGNOSIS — G35 Multiple sclerosis: Secondary | ICD-10-CM | POA: Diagnosis not present

## 2020-03-30 DIAGNOSIS — M6281 Muscle weakness (generalized): Secondary | ICD-10-CM | POA: Diagnosis not present

## 2020-03-30 DIAGNOSIS — R2689 Other abnormalities of gait and mobility: Secondary | ICD-10-CM | POA: Diagnosis not present

## 2020-03-30 DIAGNOSIS — M1712 Unilateral primary osteoarthritis, left knee: Secondary | ICD-10-CM | POA: Diagnosis not present

## 2020-03-30 DIAGNOSIS — M25562 Pain in left knee: Secondary | ICD-10-CM | POA: Diagnosis not present

## 2020-03-31 DIAGNOSIS — M6281 Muscle weakness (generalized): Secondary | ICD-10-CM | POA: Diagnosis not present

## 2020-03-31 DIAGNOSIS — M25562 Pain in left knee: Secondary | ICD-10-CM | POA: Diagnosis not present

## 2020-03-31 DIAGNOSIS — G35 Multiple sclerosis: Secondary | ICD-10-CM | POA: Diagnosis not present

## 2020-03-31 DIAGNOSIS — R2689 Other abnormalities of gait and mobility: Secondary | ICD-10-CM | POA: Diagnosis not present

## 2020-03-31 DIAGNOSIS — M1712 Unilateral primary osteoarthritis, left knee: Secondary | ICD-10-CM | POA: Diagnosis not present

## 2020-04-04 DIAGNOSIS — M1712 Unilateral primary osteoarthritis, left knee: Secondary | ICD-10-CM | POA: Diagnosis not present

## 2020-04-04 DIAGNOSIS — G35 Multiple sclerosis: Secondary | ICD-10-CM | POA: Diagnosis not present

## 2020-04-04 DIAGNOSIS — M25562 Pain in left knee: Secondary | ICD-10-CM | POA: Diagnosis not present

## 2020-04-04 DIAGNOSIS — M6281 Muscle weakness (generalized): Secondary | ICD-10-CM | POA: Diagnosis not present

## 2020-04-04 DIAGNOSIS — R2689 Other abnormalities of gait and mobility: Secondary | ICD-10-CM | POA: Diagnosis not present

## 2020-04-06 DIAGNOSIS — G35 Multiple sclerosis: Secondary | ICD-10-CM | POA: Diagnosis not present

## 2020-04-06 DIAGNOSIS — M1712 Unilateral primary osteoarthritis, left knee: Secondary | ICD-10-CM | POA: Diagnosis not present

## 2020-04-06 DIAGNOSIS — M25562 Pain in left knee: Secondary | ICD-10-CM | POA: Diagnosis not present

## 2020-04-06 DIAGNOSIS — R2689 Other abnormalities of gait and mobility: Secondary | ICD-10-CM | POA: Diagnosis not present

## 2020-04-06 DIAGNOSIS — M6281 Muscle weakness (generalized): Secondary | ICD-10-CM | POA: Diagnosis not present

## 2020-04-07 DIAGNOSIS — M25562 Pain in left knee: Secondary | ICD-10-CM | POA: Diagnosis not present

## 2020-04-07 DIAGNOSIS — M1712 Unilateral primary osteoarthritis, left knee: Secondary | ICD-10-CM | POA: Diagnosis not present

## 2020-04-07 DIAGNOSIS — R2689 Other abnormalities of gait and mobility: Secondary | ICD-10-CM | POA: Diagnosis not present

## 2020-04-07 DIAGNOSIS — M6281 Muscle weakness (generalized): Secondary | ICD-10-CM | POA: Diagnosis not present

## 2020-04-07 DIAGNOSIS — G35 Multiple sclerosis: Secondary | ICD-10-CM | POA: Diagnosis not present

## 2020-04-11 DIAGNOSIS — M6281 Muscle weakness (generalized): Secondary | ICD-10-CM | POA: Diagnosis not present

## 2020-04-11 DIAGNOSIS — M1712 Unilateral primary osteoarthritis, left knee: Secondary | ICD-10-CM | POA: Diagnosis not present

## 2020-04-11 DIAGNOSIS — R2689 Other abnormalities of gait and mobility: Secondary | ICD-10-CM | POA: Diagnosis not present

## 2020-04-11 DIAGNOSIS — G35 Multiple sclerosis: Secondary | ICD-10-CM | POA: Diagnosis not present

## 2020-04-11 DIAGNOSIS — M25562 Pain in left knee: Secondary | ICD-10-CM | POA: Diagnosis not present

## 2020-04-13 DIAGNOSIS — R2689 Other abnormalities of gait and mobility: Secondary | ICD-10-CM | POA: Diagnosis not present

## 2020-04-13 DIAGNOSIS — G35 Multiple sclerosis: Secondary | ICD-10-CM | POA: Diagnosis not present

## 2020-04-13 DIAGNOSIS — M25562 Pain in left knee: Secondary | ICD-10-CM | POA: Diagnosis not present

## 2020-04-13 DIAGNOSIS — M1712 Unilateral primary osteoarthritis, left knee: Secondary | ICD-10-CM | POA: Diagnosis not present

## 2020-04-13 DIAGNOSIS — M6281 Muscle weakness (generalized): Secondary | ICD-10-CM | POA: Diagnosis not present

## 2020-04-14 DIAGNOSIS — M6281 Muscle weakness (generalized): Secondary | ICD-10-CM | POA: Diagnosis not present

## 2020-04-14 DIAGNOSIS — R2689 Other abnormalities of gait and mobility: Secondary | ICD-10-CM | POA: Diagnosis not present

## 2020-04-14 DIAGNOSIS — M25562 Pain in left knee: Secondary | ICD-10-CM | POA: Diagnosis not present

## 2020-04-14 DIAGNOSIS — M1712 Unilateral primary osteoarthritis, left knee: Secondary | ICD-10-CM | POA: Diagnosis not present

## 2020-04-14 DIAGNOSIS — G35 Multiple sclerosis: Secondary | ICD-10-CM | POA: Diagnosis not present

## 2020-04-18 DIAGNOSIS — R2689 Other abnormalities of gait and mobility: Secondary | ICD-10-CM | POA: Diagnosis not present

## 2020-04-18 DIAGNOSIS — M1712 Unilateral primary osteoarthritis, left knee: Secondary | ICD-10-CM | POA: Diagnosis not present

## 2020-04-18 DIAGNOSIS — G35 Multiple sclerosis: Secondary | ICD-10-CM | POA: Diagnosis not present

## 2020-04-18 DIAGNOSIS — M6281 Muscle weakness (generalized): Secondary | ICD-10-CM | POA: Diagnosis not present

## 2020-04-18 DIAGNOSIS — M25562 Pain in left knee: Secondary | ICD-10-CM | POA: Diagnosis not present

## 2020-04-20 DIAGNOSIS — R2689 Other abnormalities of gait and mobility: Secondary | ICD-10-CM | POA: Diagnosis not present

## 2020-04-20 DIAGNOSIS — M6281 Muscle weakness (generalized): Secondary | ICD-10-CM | POA: Diagnosis not present

## 2020-04-20 DIAGNOSIS — G35 Multiple sclerosis: Secondary | ICD-10-CM | POA: Diagnosis not present

## 2020-04-20 DIAGNOSIS — M1712 Unilateral primary osteoarthritis, left knee: Secondary | ICD-10-CM | POA: Diagnosis not present

## 2020-04-20 DIAGNOSIS — M25562 Pain in left knee: Secondary | ICD-10-CM | POA: Diagnosis not present

## 2020-04-21 DIAGNOSIS — M25562 Pain in left knee: Secondary | ICD-10-CM | POA: Diagnosis not present

## 2020-04-21 DIAGNOSIS — M6281 Muscle weakness (generalized): Secondary | ICD-10-CM | POA: Diagnosis not present

## 2020-04-21 DIAGNOSIS — G35 Multiple sclerosis: Secondary | ICD-10-CM | POA: Diagnosis not present

## 2020-04-21 DIAGNOSIS — M1712 Unilateral primary osteoarthritis, left knee: Secondary | ICD-10-CM | POA: Diagnosis not present

## 2020-04-21 DIAGNOSIS — R2689 Other abnormalities of gait and mobility: Secondary | ICD-10-CM | POA: Diagnosis not present

## 2020-04-25 DIAGNOSIS — M6281 Muscle weakness (generalized): Secondary | ICD-10-CM | POA: Diagnosis not present

## 2020-04-25 DIAGNOSIS — N3941 Urge incontinence: Secondary | ICD-10-CM | POA: Diagnosis not present

## 2020-04-25 DIAGNOSIS — R2689 Other abnormalities of gait and mobility: Secondary | ICD-10-CM | POA: Diagnosis not present

## 2020-04-25 DIAGNOSIS — M1712 Unilateral primary osteoarthritis, left knee: Secondary | ICD-10-CM | POA: Diagnosis not present

## 2020-04-25 DIAGNOSIS — G35 Multiple sclerosis: Secondary | ICD-10-CM | POA: Diagnosis not present

## 2020-04-25 DIAGNOSIS — M25562 Pain in left knee: Secondary | ICD-10-CM | POA: Diagnosis not present

## 2020-04-27 DIAGNOSIS — M1712 Unilateral primary osteoarthritis, left knee: Secondary | ICD-10-CM | POA: Diagnosis not present

## 2020-04-27 DIAGNOSIS — G35 Multiple sclerosis: Secondary | ICD-10-CM | POA: Diagnosis not present

## 2020-04-27 DIAGNOSIS — M25562 Pain in left knee: Secondary | ICD-10-CM | POA: Diagnosis not present

## 2020-04-27 DIAGNOSIS — M6281 Muscle weakness (generalized): Secondary | ICD-10-CM | POA: Diagnosis not present

## 2020-04-27 DIAGNOSIS — R2689 Other abnormalities of gait and mobility: Secondary | ICD-10-CM | POA: Diagnosis not present

## 2020-04-28 DIAGNOSIS — M1712 Unilateral primary osteoarthritis, left knee: Secondary | ICD-10-CM | POA: Diagnosis not present

## 2020-04-28 DIAGNOSIS — G35 Multiple sclerosis: Secondary | ICD-10-CM | POA: Diagnosis not present

## 2020-04-28 DIAGNOSIS — R2689 Other abnormalities of gait and mobility: Secondary | ICD-10-CM | POA: Diagnosis not present

## 2020-04-28 DIAGNOSIS — M6281 Muscle weakness (generalized): Secondary | ICD-10-CM | POA: Diagnosis not present

## 2020-04-28 DIAGNOSIS — M25562 Pain in left knee: Secondary | ICD-10-CM | POA: Diagnosis not present

## 2020-05-02 DIAGNOSIS — G35 Multiple sclerosis: Secondary | ICD-10-CM | POA: Diagnosis not present

## 2020-05-02 DIAGNOSIS — R2689 Other abnormalities of gait and mobility: Secondary | ICD-10-CM | POA: Diagnosis not present

## 2020-05-02 DIAGNOSIS — M1712 Unilateral primary osteoarthritis, left knee: Secondary | ICD-10-CM | POA: Diagnosis not present

## 2020-05-02 DIAGNOSIS — M25562 Pain in left knee: Secondary | ICD-10-CM | POA: Diagnosis not present

## 2020-05-02 DIAGNOSIS — M6281 Muscle weakness (generalized): Secondary | ICD-10-CM | POA: Diagnosis not present

## 2020-05-04 DIAGNOSIS — G35 Multiple sclerosis: Secondary | ICD-10-CM | POA: Diagnosis not present

## 2020-05-04 DIAGNOSIS — M6281 Muscle weakness (generalized): Secondary | ICD-10-CM | POA: Diagnosis not present

## 2020-05-04 DIAGNOSIS — M25562 Pain in left knee: Secondary | ICD-10-CM | POA: Diagnosis not present

## 2020-05-04 DIAGNOSIS — M1712 Unilateral primary osteoarthritis, left knee: Secondary | ICD-10-CM | POA: Diagnosis not present

## 2020-05-04 DIAGNOSIS — R2689 Other abnormalities of gait and mobility: Secondary | ICD-10-CM | POA: Diagnosis not present

## 2020-05-05 DIAGNOSIS — M6281 Muscle weakness (generalized): Secondary | ICD-10-CM | POA: Diagnosis not present

## 2020-05-05 DIAGNOSIS — G35 Multiple sclerosis: Secondary | ICD-10-CM | POA: Diagnosis not present

## 2020-05-05 DIAGNOSIS — M1712 Unilateral primary osteoarthritis, left knee: Secondary | ICD-10-CM | POA: Diagnosis not present

## 2020-05-05 DIAGNOSIS — M25562 Pain in left knee: Secondary | ICD-10-CM | POA: Diagnosis not present

## 2020-05-05 DIAGNOSIS — R2689 Other abnormalities of gait and mobility: Secondary | ICD-10-CM | POA: Diagnosis not present

## 2020-05-09 DIAGNOSIS — R2689 Other abnormalities of gait and mobility: Secondary | ICD-10-CM | POA: Diagnosis not present

## 2020-05-09 DIAGNOSIS — Z23 Encounter for immunization: Secondary | ICD-10-CM | POA: Diagnosis not present

## 2020-05-09 DIAGNOSIS — M1712 Unilateral primary osteoarthritis, left knee: Secondary | ICD-10-CM | POA: Diagnosis not present

## 2020-05-09 DIAGNOSIS — M6281 Muscle weakness (generalized): Secondary | ICD-10-CM | POA: Diagnosis not present

## 2020-05-09 DIAGNOSIS — G35 Multiple sclerosis: Secondary | ICD-10-CM | POA: Diagnosis not present

## 2020-05-09 DIAGNOSIS — M25562 Pain in left knee: Secondary | ICD-10-CM | POA: Diagnosis not present

## 2020-05-11 DIAGNOSIS — M25562 Pain in left knee: Secondary | ICD-10-CM | POA: Diagnosis not present

## 2020-05-11 DIAGNOSIS — G35 Multiple sclerosis: Secondary | ICD-10-CM | POA: Diagnosis not present

## 2020-05-11 DIAGNOSIS — M6281 Muscle weakness (generalized): Secondary | ICD-10-CM | POA: Diagnosis not present

## 2020-05-11 DIAGNOSIS — R2689 Other abnormalities of gait and mobility: Secondary | ICD-10-CM | POA: Diagnosis not present

## 2020-05-11 DIAGNOSIS — M1712 Unilateral primary osteoarthritis, left knee: Secondary | ICD-10-CM | POA: Diagnosis not present

## 2020-05-12 DIAGNOSIS — M1712 Unilateral primary osteoarthritis, left knee: Secondary | ICD-10-CM | POA: Diagnosis not present

## 2020-05-12 DIAGNOSIS — M6281 Muscle weakness (generalized): Secondary | ICD-10-CM | POA: Diagnosis not present

## 2020-05-12 DIAGNOSIS — M25562 Pain in left knee: Secondary | ICD-10-CM | POA: Diagnosis not present

## 2020-05-12 DIAGNOSIS — G35 Multiple sclerosis: Secondary | ICD-10-CM | POA: Diagnosis not present

## 2020-05-12 DIAGNOSIS — R2689 Other abnormalities of gait and mobility: Secondary | ICD-10-CM | POA: Diagnosis not present

## 2020-05-16 DIAGNOSIS — R2689 Other abnormalities of gait and mobility: Secondary | ICD-10-CM | POA: Diagnosis not present

## 2020-05-16 DIAGNOSIS — M25562 Pain in left knee: Secondary | ICD-10-CM | POA: Diagnosis not present

## 2020-05-16 DIAGNOSIS — G35 Multiple sclerosis: Secondary | ICD-10-CM | POA: Diagnosis not present

## 2020-05-16 DIAGNOSIS — M6281 Muscle weakness (generalized): Secondary | ICD-10-CM | POA: Diagnosis not present

## 2020-05-16 DIAGNOSIS — M1712 Unilateral primary osteoarthritis, left knee: Secondary | ICD-10-CM | POA: Diagnosis not present

## 2020-05-18 DIAGNOSIS — M1712 Unilateral primary osteoarthritis, left knee: Secondary | ICD-10-CM | POA: Diagnosis not present

## 2020-05-18 DIAGNOSIS — M170 Bilateral primary osteoarthritis of knee: Secondary | ICD-10-CM | POA: Diagnosis not present

## 2020-05-18 DIAGNOSIS — M25562 Pain in left knee: Secondary | ICD-10-CM | POA: Diagnosis not present

## 2020-05-18 DIAGNOSIS — M7062 Trochanteric bursitis, left hip: Secondary | ICD-10-CM | POA: Diagnosis not present

## 2020-05-18 DIAGNOSIS — G35 Multiple sclerosis: Secondary | ICD-10-CM | POA: Diagnosis not present

## 2020-05-18 DIAGNOSIS — R2689 Other abnormalities of gait and mobility: Secondary | ICD-10-CM | POA: Diagnosis not present

## 2020-05-18 DIAGNOSIS — M6281 Muscle weakness (generalized): Secondary | ICD-10-CM | POA: Diagnosis not present

## 2020-05-19 DIAGNOSIS — G35 Multiple sclerosis: Secondary | ICD-10-CM | POA: Diagnosis not present

## 2020-05-19 DIAGNOSIS — M25562 Pain in left knee: Secondary | ICD-10-CM | POA: Diagnosis not present

## 2020-05-19 DIAGNOSIS — R2689 Other abnormalities of gait and mobility: Secondary | ICD-10-CM | POA: Diagnosis not present

## 2020-05-19 DIAGNOSIS — M6281 Muscle weakness (generalized): Secondary | ICD-10-CM | POA: Diagnosis not present

## 2020-05-19 DIAGNOSIS — M1712 Unilateral primary osteoarthritis, left knee: Secondary | ICD-10-CM | POA: Diagnosis not present

## 2020-05-23 DIAGNOSIS — M1712 Unilateral primary osteoarthritis, left knee: Secondary | ICD-10-CM | POA: Diagnosis not present

## 2020-05-23 DIAGNOSIS — R350 Frequency of micturition: Secondary | ICD-10-CM | POA: Diagnosis not present

## 2020-05-23 DIAGNOSIS — M6281 Muscle weakness (generalized): Secondary | ICD-10-CM | POA: Diagnosis not present

## 2020-05-23 DIAGNOSIS — G35 Multiple sclerosis: Secondary | ICD-10-CM | POA: Diagnosis not present

## 2020-05-23 DIAGNOSIS — R2689 Other abnormalities of gait and mobility: Secondary | ICD-10-CM | POA: Diagnosis not present

## 2020-05-23 DIAGNOSIS — M25562 Pain in left knee: Secondary | ICD-10-CM | POA: Diagnosis not present

## 2020-05-23 DIAGNOSIS — N3941 Urge incontinence: Secondary | ICD-10-CM | POA: Diagnosis not present

## 2020-05-25 DIAGNOSIS — M25562 Pain in left knee: Secondary | ICD-10-CM | POA: Diagnosis not present

## 2020-05-25 DIAGNOSIS — M1712 Unilateral primary osteoarthritis, left knee: Secondary | ICD-10-CM | POA: Diagnosis not present

## 2020-05-25 DIAGNOSIS — G35 Multiple sclerosis: Secondary | ICD-10-CM | POA: Diagnosis not present

## 2020-05-25 DIAGNOSIS — M6281 Muscle weakness (generalized): Secondary | ICD-10-CM | POA: Diagnosis not present

## 2020-05-25 DIAGNOSIS — R2689 Other abnormalities of gait and mobility: Secondary | ICD-10-CM | POA: Diagnosis not present

## 2020-05-26 DIAGNOSIS — M1712 Unilateral primary osteoarthritis, left knee: Secondary | ICD-10-CM | POA: Diagnosis not present

## 2020-05-26 DIAGNOSIS — R2689 Other abnormalities of gait and mobility: Secondary | ICD-10-CM | POA: Diagnosis not present

## 2020-05-26 DIAGNOSIS — M25562 Pain in left knee: Secondary | ICD-10-CM | POA: Diagnosis not present

## 2020-05-26 DIAGNOSIS — M6281 Muscle weakness (generalized): Secondary | ICD-10-CM | POA: Diagnosis not present

## 2020-05-26 DIAGNOSIS — G35 Multiple sclerosis: Secondary | ICD-10-CM | POA: Diagnosis not present

## 2020-05-30 DIAGNOSIS — M1712 Unilateral primary osteoarthritis, left knee: Secondary | ICD-10-CM | POA: Diagnosis not present

## 2020-05-30 DIAGNOSIS — R2689 Other abnormalities of gait and mobility: Secondary | ICD-10-CM | POA: Diagnosis not present

## 2020-05-30 DIAGNOSIS — M6281 Muscle weakness (generalized): Secondary | ICD-10-CM | POA: Diagnosis not present

## 2020-05-30 DIAGNOSIS — M25562 Pain in left knee: Secondary | ICD-10-CM | POA: Diagnosis not present

## 2020-05-30 DIAGNOSIS — G35 Multiple sclerosis: Secondary | ICD-10-CM | POA: Diagnosis not present

## 2020-06-01 DIAGNOSIS — M1712 Unilateral primary osteoarthritis, left knee: Secondary | ICD-10-CM | POA: Diagnosis not present

## 2020-06-01 DIAGNOSIS — M6281 Muscle weakness (generalized): Secondary | ICD-10-CM | POA: Diagnosis not present

## 2020-06-01 DIAGNOSIS — M25562 Pain in left knee: Secondary | ICD-10-CM | POA: Diagnosis not present

## 2020-06-01 DIAGNOSIS — G35 Multiple sclerosis: Secondary | ICD-10-CM | POA: Diagnosis not present

## 2020-06-01 DIAGNOSIS — R2689 Other abnormalities of gait and mobility: Secondary | ICD-10-CM | POA: Diagnosis not present

## 2020-06-02 DIAGNOSIS — G35 Multiple sclerosis: Secondary | ICD-10-CM | POA: Diagnosis not present

## 2020-06-02 DIAGNOSIS — M6281 Muscle weakness (generalized): Secondary | ICD-10-CM | POA: Diagnosis not present

## 2020-06-02 DIAGNOSIS — M25562 Pain in left knee: Secondary | ICD-10-CM | POA: Diagnosis not present

## 2020-06-02 DIAGNOSIS — R2689 Other abnormalities of gait and mobility: Secondary | ICD-10-CM | POA: Diagnosis not present

## 2020-06-02 DIAGNOSIS — M1712 Unilateral primary osteoarthritis, left knee: Secondary | ICD-10-CM | POA: Diagnosis not present

## 2020-06-06 DIAGNOSIS — M25562 Pain in left knee: Secondary | ICD-10-CM | POA: Diagnosis not present

## 2020-06-06 DIAGNOSIS — M1712 Unilateral primary osteoarthritis, left knee: Secondary | ICD-10-CM | POA: Diagnosis not present

## 2020-06-06 DIAGNOSIS — E7801 Familial hypercholesterolemia: Secondary | ICD-10-CM | POA: Diagnosis not present

## 2020-06-06 DIAGNOSIS — G35 Multiple sclerosis: Secondary | ICD-10-CM | POA: Diagnosis not present

## 2020-06-06 DIAGNOSIS — I10 Essential (primary) hypertension: Secondary | ICD-10-CM | POA: Diagnosis not present

## 2020-06-06 DIAGNOSIS — R2689 Other abnormalities of gait and mobility: Secondary | ICD-10-CM | POA: Diagnosis not present

## 2020-06-06 DIAGNOSIS — M6281 Muscle weakness (generalized): Secondary | ICD-10-CM | POA: Diagnosis not present

## 2020-06-08 DIAGNOSIS — M25562 Pain in left knee: Secondary | ICD-10-CM | POA: Diagnosis not present

## 2020-06-08 DIAGNOSIS — M1712 Unilateral primary osteoarthritis, left knee: Secondary | ICD-10-CM | POA: Diagnosis not present

## 2020-06-08 DIAGNOSIS — R2689 Other abnormalities of gait and mobility: Secondary | ICD-10-CM | POA: Diagnosis not present

## 2020-06-08 DIAGNOSIS — M6281 Muscle weakness (generalized): Secondary | ICD-10-CM | POA: Diagnosis not present

## 2020-06-08 DIAGNOSIS — G35 Multiple sclerosis: Secondary | ICD-10-CM | POA: Diagnosis not present

## 2020-06-09 DIAGNOSIS — R2689 Other abnormalities of gait and mobility: Secondary | ICD-10-CM | POA: Diagnosis not present

## 2020-06-09 DIAGNOSIS — G35 Multiple sclerosis: Secondary | ICD-10-CM | POA: Diagnosis not present

## 2020-06-09 DIAGNOSIS — M25562 Pain in left knee: Secondary | ICD-10-CM | POA: Diagnosis not present

## 2020-06-09 DIAGNOSIS — M1712 Unilateral primary osteoarthritis, left knee: Secondary | ICD-10-CM | POA: Diagnosis not present

## 2020-06-09 DIAGNOSIS — M6281 Muscle weakness (generalized): Secondary | ICD-10-CM | POA: Diagnosis not present

## 2020-06-13 DIAGNOSIS — M6281 Muscle weakness (generalized): Secondary | ICD-10-CM | POA: Diagnosis not present

## 2020-06-13 DIAGNOSIS — R2689 Other abnormalities of gait and mobility: Secondary | ICD-10-CM | POA: Diagnosis not present

## 2020-06-13 DIAGNOSIS — G35 Multiple sclerosis: Secondary | ICD-10-CM | POA: Diagnosis not present

## 2020-06-13 DIAGNOSIS — M1712 Unilateral primary osteoarthritis, left knee: Secondary | ICD-10-CM | POA: Diagnosis not present

## 2020-06-13 DIAGNOSIS — M25562 Pain in left knee: Secondary | ICD-10-CM | POA: Diagnosis not present

## 2020-06-15 DIAGNOSIS — I10 Essential (primary) hypertension: Secondary | ICD-10-CM | POA: Diagnosis not present

## 2020-06-15 DIAGNOSIS — R413 Other amnesia: Secondary | ICD-10-CM | POA: Diagnosis not present

## 2020-06-15 DIAGNOSIS — D485 Neoplasm of uncertain behavior of skin: Secondary | ICD-10-CM | POA: Diagnosis not present

## 2020-06-15 DIAGNOSIS — R350 Frequency of micturition: Secondary | ICD-10-CM | POA: Diagnosis not present

## 2020-06-15 DIAGNOSIS — R2689 Other abnormalities of gait and mobility: Secondary | ICD-10-CM | POA: Diagnosis not present

## 2020-06-15 DIAGNOSIS — M1712 Unilateral primary osteoarthritis, left knee: Secondary | ICD-10-CM | POA: Diagnosis not present

## 2020-06-15 DIAGNOSIS — M810 Age-related osteoporosis without current pathological fracture: Secondary | ICD-10-CM | POA: Diagnosis not present

## 2020-06-15 DIAGNOSIS — G35 Multiple sclerosis: Secondary | ICD-10-CM | POA: Diagnosis not present

## 2020-06-15 DIAGNOSIS — M25562 Pain in left knee: Secondary | ICD-10-CM | POA: Diagnosis not present

## 2020-06-15 DIAGNOSIS — Z0000 Encounter for general adult medical examination without abnormal findings: Secondary | ICD-10-CM | POA: Diagnosis not present

## 2020-06-15 DIAGNOSIS — K219 Gastro-esophageal reflux disease without esophagitis: Secondary | ICD-10-CM | POA: Diagnosis not present

## 2020-06-15 DIAGNOSIS — M6281 Muscle weakness (generalized): Secondary | ICD-10-CM | POA: Diagnosis not present

## 2020-06-15 DIAGNOSIS — M8580 Other specified disorders of bone density and structure, unspecified site: Secondary | ICD-10-CM | POA: Diagnosis not present

## 2020-06-20 DIAGNOSIS — M1712 Unilateral primary osteoarthritis, left knee: Secondary | ICD-10-CM | POA: Diagnosis not present

## 2020-06-20 DIAGNOSIS — N3941 Urge incontinence: Secondary | ICD-10-CM | POA: Diagnosis not present

## 2020-06-20 DIAGNOSIS — M25562 Pain in left knee: Secondary | ICD-10-CM | POA: Diagnosis not present

## 2020-06-20 DIAGNOSIS — R350 Frequency of micturition: Secondary | ICD-10-CM | POA: Diagnosis not present

## 2020-06-20 DIAGNOSIS — M6281 Muscle weakness (generalized): Secondary | ICD-10-CM | POA: Diagnosis not present

## 2020-06-20 DIAGNOSIS — G35 Multiple sclerosis: Secondary | ICD-10-CM | POA: Diagnosis not present

## 2020-06-20 DIAGNOSIS — R2689 Other abnormalities of gait and mobility: Secondary | ICD-10-CM | POA: Diagnosis not present

## 2020-06-22 DIAGNOSIS — M25562 Pain in left knee: Secondary | ICD-10-CM | POA: Diagnosis not present

## 2020-06-22 DIAGNOSIS — M1712 Unilateral primary osteoarthritis, left knee: Secondary | ICD-10-CM | POA: Diagnosis not present

## 2020-06-22 DIAGNOSIS — R2689 Other abnormalities of gait and mobility: Secondary | ICD-10-CM | POA: Diagnosis not present

## 2020-06-22 DIAGNOSIS — M6281 Muscle weakness (generalized): Secondary | ICD-10-CM | POA: Diagnosis not present

## 2020-06-22 DIAGNOSIS — G35 Multiple sclerosis: Secondary | ICD-10-CM | POA: Diagnosis not present

## 2020-06-27 DIAGNOSIS — M6281 Muscle weakness (generalized): Secondary | ICD-10-CM | POA: Diagnosis not present

## 2020-06-27 DIAGNOSIS — M25562 Pain in left knee: Secondary | ICD-10-CM | POA: Diagnosis not present

## 2020-06-27 DIAGNOSIS — M1712 Unilateral primary osteoarthritis, left knee: Secondary | ICD-10-CM | POA: Diagnosis not present

## 2020-06-27 DIAGNOSIS — R2689 Other abnormalities of gait and mobility: Secondary | ICD-10-CM | POA: Diagnosis not present

## 2020-06-27 DIAGNOSIS — G35 Multiple sclerosis: Secondary | ICD-10-CM | POA: Diagnosis not present

## 2020-06-29 DIAGNOSIS — G35 Multiple sclerosis: Secondary | ICD-10-CM | POA: Diagnosis not present

## 2020-06-29 DIAGNOSIS — M25562 Pain in left knee: Secondary | ICD-10-CM | POA: Diagnosis not present

## 2020-06-29 DIAGNOSIS — M6281 Muscle weakness (generalized): Secondary | ICD-10-CM | POA: Diagnosis not present

## 2020-06-29 DIAGNOSIS — R2689 Other abnormalities of gait and mobility: Secondary | ICD-10-CM | POA: Diagnosis not present

## 2020-06-29 DIAGNOSIS — M1712 Unilateral primary osteoarthritis, left knee: Secondary | ICD-10-CM | POA: Diagnosis not present

## 2020-07-04 DIAGNOSIS — M6281 Muscle weakness (generalized): Secondary | ICD-10-CM | POA: Diagnosis not present

## 2020-07-04 DIAGNOSIS — R2689 Other abnormalities of gait and mobility: Secondary | ICD-10-CM | POA: Diagnosis not present

## 2020-07-04 DIAGNOSIS — M25562 Pain in left knee: Secondary | ICD-10-CM | POA: Diagnosis not present

## 2020-07-04 DIAGNOSIS — G35 Multiple sclerosis: Secondary | ICD-10-CM | POA: Diagnosis not present

## 2020-07-04 DIAGNOSIS — M1712 Unilateral primary osteoarthritis, left knee: Secondary | ICD-10-CM | POA: Diagnosis not present

## 2020-07-18 DIAGNOSIS — R350 Frequency of micturition: Secondary | ICD-10-CM | POA: Diagnosis not present

## 2020-07-18 DIAGNOSIS — N3941 Urge incontinence: Secondary | ICD-10-CM | POA: Diagnosis not present

## 2020-07-27 DIAGNOSIS — M1712 Unilateral primary osteoarthritis, left knee: Secondary | ICD-10-CM | POA: Diagnosis not present

## 2020-07-27 DIAGNOSIS — M170 Bilateral primary osteoarthritis of knee: Secondary | ICD-10-CM | POA: Diagnosis not present

## 2020-08-03 DIAGNOSIS — K219 Gastro-esophageal reflux disease without esophagitis: Secondary | ICD-10-CM | POA: Diagnosis not present

## 2020-08-03 DIAGNOSIS — F0391 Unspecified dementia with behavioral disturbance: Secondary | ICD-10-CM | POA: Diagnosis not present

## 2020-08-03 DIAGNOSIS — F22 Delusional disorders: Secondary | ICD-10-CM | POA: Diagnosis not present

## 2020-08-03 DIAGNOSIS — M8580 Other specified disorders of bone density and structure, unspecified site: Secondary | ICD-10-CM | POA: Diagnosis not present

## 2020-08-17 DIAGNOSIS — F22 Delusional disorders: Secondary | ICD-10-CM | POA: Diagnosis not present

## 2020-08-17 DIAGNOSIS — K219 Gastro-esophageal reflux disease without esophagitis: Secondary | ICD-10-CM | POA: Diagnosis not present

## 2020-09-19 DIAGNOSIS — L821 Other seborrheic keratosis: Secondary | ICD-10-CM | POA: Diagnosis not present

## 2020-10-10 DIAGNOSIS — Z23 Encounter for immunization: Secondary | ICD-10-CM | POA: Diagnosis not present

## 2020-11-21 DIAGNOSIS — F22 Delusional disorders: Secondary | ICD-10-CM | POA: Diagnosis not present

## 2020-11-21 DIAGNOSIS — K219 Gastro-esophageal reflux disease without esophagitis: Secondary | ICD-10-CM | POA: Diagnosis not present

## 2020-11-27 DIAGNOSIS — Z20822 Contact with and (suspected) exposure to covid-19: Secondary | ICD-10-CM | POA: Diagnosis not present

## 2020-12-05 ENCOUNTER — Ambulatory Visit (INDEPENDENT_AMBULATORY_CARE_PROVIDER_SITE_OTHER): Payer: Medicare Other | Admitting: Neurology

## 2020-12-05 ENCOUNTER — Encounter: Payer: Self-pay | Admitting: Neurology

## 2020-12-05 ENCOUNTER — Other Ambulatory Visit: Payer: Self-pay

## 2020-12-05 ENCOUNTER — Ambulatory Visit: Payer: Medicare Other | Admitting: Neurology

## 2020-12-05 VITALS — BP 120/80 | HR 76 | Ht 65.0 in | Wt 146.0 lb

## 2020-12-05 DIAGNOSIS — G35 Multiple sclerosis: Secondary | ICD-10-CM

## 2020-12-05 DIAGNOSIS — F03B3 Unspecified dementia, moderate, with mood disturbance: Secondary | ICD-10-CM | POA: Diagnosis not present

## 2020-12-05 DIAGNOSIS — F0280 Dementia in other diseases classified elsewhere without behavioral disturbance: Secondary | ICD-10-CM | POA: Insufficient documentation

## 2020-12-05 DIAGNOSIS — R269 Unspecified abnormalities of gait and mobility: Secondary | ICD-10-CM | POA: Diagnosis not present

## 2020-12-05 NOTE — Progress Notes (Signed)
[facility]  PATIENT: Jennifer Atkins DOB: 1934-10-03   HISTORY OF PRESENT ILLNESS: Jennifer Atkins is a 86 year old right-handed Caucasian female, followup for multiple sclerosis, her primary care physician is [facility] Dr. Shelia Media, last clinical visit was with Jennifer Atkins in May 2014   She had past medical history of relapsing remitting multiple sclerosis, was patient of Dr. Erling Cruz for many years, she had gait abnormality, she had right optic neuritis with symptoms beginning in 1980, with total recovery within one month after po steroid treatment, recurrent right optic neuritis again a year later.     Over the years, she has developed gradual onset gait difficulty, especially since 2014, also fatigue, mild memory loss,    She has never been on any immunomodulating medications.    She was highly functional, driving, water aerobic, golf regularly, she has urinary urgency, nocturia, taking vesicare, occasionally bladder incontinence when she gets up from a seated position   She fell in Jan 2015, went to ED, had left forehead laceration, previously, she fell in August 2014 at [facility].    She was referred to physical therapy afterwards, machine exercise has made her right leg worse, right foot pain.   She now complains of worsening right foot numbness, right knee pain, has right meniscus repair in the past, right leg numbness, weakness, she also has mild left foot numbness.    UPDATE July 01 2013:  She fell again on June 10th 2015, without warning signs, now with right thumb pain, now she has right foot numbness, multiple falling episodes over past one year. We have reviewed MRI of the brain, mild atrophy, scattered periventricular white matter disease, small vessel disease vs. MS lesions,   MRI thoracic spine,Subtle T2 hyperintensity at T6-7 level, may represent chronic demyelinating plaque. No acute plaques are seen.   MRI lumbar spine (without)  demonstrating multilevel degenerative disc disease, most severe at L5-S1, disc bulging and facet hypertrophy with moderate right and mild left foraminal stenosis; potential impingement upon the right L5 and descending bilateral S1 roots. L3-4: disc bulging and facet hypertrophy with mild right and moderate left foraminal stenosis.  L4-5: disc bulging and facet hypertrophy with moderate right and mild left foraminal stenosis    MRI cervical:C4-5: disc bulging with mild biforaminal stenosis. C5-6. C6-7: disc bulging and uncovertebral joint hypertrophy with mild biforaminal stenosis. No intrinsic or compressive spinal cord lesions. No abnormal enhancing lesions.   UPDATE July 11 2014: She continues to do very well, exercise regularly, never was treated with immunomodulation therapy, she had right knee replacement October 2015, recovered very well. She complains of worsening bowel and bladder urgency,occasionally incontinence,   She complains of lack of stamina,   UPDATE June 19th 2017: She still has right knee pain despite previous right knee replacement,, also left knee pain, she still exercise regularly, water aerobic, which has been helpful, she [place], she has more right foot numbness, she does not feel confidence driving on the high way.  She still plays golf, she has six children, she will visit her son, she still lives in her house.   UPDATE September 04 2016: She is with her daughter at visit today, she complains of left hip pain, radiating pain to left leg since early August 2018, she was seen by orthopedic, was given a course of po steroid, which helps her some.   We have personally reviewed MRI seen 2015,MRI of the lumbar region showed multilevel significant degenerative changes,  most severe at L5-S1, bulging disc and facet hypertrophy, moderate right, mild left foraminal narrowing, potential impingement on the right L5 nerve roots,   She also complains of slow worsening  memory loss  UPDATE Nov 12 2018: She complains of increased fatigue, urinary incontinence, occasionally bowel incontinence, she was not able to for 6 months, now she is able to do water aerobic again, she also complains of mild memory loss, Personally reviewed MRIs in 2015, MRI of brain generalized atrophy, few supratentorium T2/FLAIR hyperdensity lesion, MRI of cervical spine, multilevel degenerative changes, no cord lesion.  MRI of thoracic spine subtle T2 hyperintensity at the T6-7 level, no acute plaque.  MRI of lumbar multilevel degenerative changes, moderate right foraminal stenosis at L5-S1, potentially impingement of the right L5 in the descending bilateral S1 nerve roots, moderate right foraminal stenosis at L4-5.  UPDATE Dec 07 2019: She moved to independent living since July 2021, because of worsening functional status, become more forgetful, difficult to manage her own medication, slow worsening gait abnormality, she continue to exercise regularly, sleeps well.  She is accompanied by her 2 daughters at today's clinical visit, who is more concerned about her mother's processing speed, difficulty managing her medications, denies significant difficulties. MoCA examination 17 out of 30 today  We also personally reviewed MRI of the brain with and without contrast in November 2021: No acute abnormality, mild to moderate generalized atrophy, supratentorium small vessel disease, mild progression compared to previous MRI in 2015  Laboratory evaluations in May 2021: Normal CBC hemoglobin of 14.3, CMP, triglyceride 168, LDL 143,  UPDATE Dec 05 2020: She is is accompanied by her daughter at today's visit, she lives at independent living at [facility], trying to participate water aerobic, does it once a week, she sleeps well, has good appetite, her knee hurt her sometimes, she has no trouble using bathroom. She is now ambulate with walker, for longer distance, receiving left knee injection  The main  concern is worsening short-term memory loss, developed agitation hallucination during the summertime, was given Seroquel 25 mg every night, which has been helpful  REVIEW OF SYSTEMS: Full 14 system review of systems performed and notable only for those listed, all others are neg:  As above  ALLERGIES: Allergies  Allergen Reactions   Latex Rash    Band-aids     HOME MEDICATIONS: Outpatient Medications Prior to Visit  Medication Sig Dispense Refill   Acetaminophen (TYLENOL ARTHRITIS PAIN PO) Take 2 tablets by mouth daily.      amLODipine (NORVASC) 2.5 MG tablet Take 2.5 mg by mouth daily.     calcium carbonate (TUMS - DOSED IN MG ELEMENTAL CALCIUM) 500 MG chewable tablet Chew 2 tablets by mouth 2 (two) times daily.     Glucosamine-Chondroitin (GLUCOSAMINE CHONDR COMPLEX PO) Take by mouth 2 (two) times daily.     Multiple Vitamins-Minerals (MULTIVITAMIN PO) Take 1 tablet by mouth daily.      omeprazole (PRILOSEC) 20 MG capsule Take 20 mg by mouth daily.     QUEtiapine (SEROQUEL) 25 MG tablet 1 tablet at bedtime.     RESTASIS 0.05 % ophthalmic emulsion Place 1 drop into both eyes 2 (two) times daily.      Trospium Chloride 60 MG CP24 Take 1 capsule by mouth daily.     valsartan (DIOVAN) 320 MG tablet Take 320 mg by mouth daily.     VITAMIN D, CHOLECALCIFEROL, PO Take 1,000 Units by mouth 2 (two) times daily.  Omega-3 Fatty Acids (FISH OIL BURP-LESS PO) Take 1 capsule by mouth daily.      No facility-administered medications prior to visit.    PAST MEDICAL HISTORY: Past Medical History:  Diagnosis Date   Arthritis    "qwhere"   Hypertension    Memory loss    "from [place] & old age; I guess" (11/11/2013)   MS (multiple sclerosis) ([place])    Optic neuritis    Urinary urgency    Vertigo     PAST SURGICAL HISTORY: Past Surgical History:  Procedure Laterality Date   BREAST BIOPSY Right 10/17/2017   fibrocystic changes   CATARACT EXTRACTION W/ INTRAOCULAR LENS  IMPLANT, BILATERAL  Bilateral 2013   COLECTOMY  1980's   S/P colonoscopy   JOINT REPLACEMENT     KNEE ARTHROSCOPY Right 1994   "from staph infection"   KNEE ARTHROSCOPY  2012   "meniscus repair"   TONSILLECTOMY  ~ [place] Right 11/10/2013   TOTAL KNEE ARTHROPLASTY Right 11/10/2013   Procedure: RIGHT TOTAL KNEE ARTHROPLASTY;  Surgeon: Ninetta Lights, MD;  Location: [place];  Service: Orthopedics;  Laterality: Right;   VAGINAL HYSTERECTOMY  1973    FAMILY HISTORY: Family History  Problem Relation Age of Onset   Cancer Father    Leukemia Mother    Heart disease Other    Heart disease Maternal Grandmother    Breast cancer Maternal Aunt     SOCIAL HISTORY: Social History   Socioeconomic History   Marital status: Widowed    Spouse name: Not on file   Number of children: 6   Years of education: College   Highest education level: Not on file  Occupational History    Comment: Retired  Tobacco Use   Smoking status: Never   Smokeless tobacco: Never  Substance and Sexual Activity   Alcohol use: Yes    Alcohol/week: 7.0 standard drinks    Types: 7 Glasses of wine per week   Drug use: No   Sexual activity: Never  Other Topics Concern   Not on file  Social History Narrative   Patient lives at [place], [place].   Right handed   Drinks tea in the morning, wine at night.   Social Determinants of Health   Financial Resource Strain: Not on file  Food Insecurity: Not on file  Transportation Needs: Not on file  Physical Activity: Not on file  Stress: Not on file  Social Connections: Not on file  Intimate Partner Violence: Not on file     PHYSICAL EXAM  Vitals:   12/05/20 1302  BP: 120/80  Pulse: 76  Weight: 146 lb (66.2 kg)  Height: 5\' 5"  (1.651 m)   Body mass index is 24.3 kg/m.  Generalized: Well developed, in no acute distress  Head: normocephalic and atraumatic,. Oropharynx benign  Neck: Supple,   Musculoskeletal: No deformity    Neurological examination  Montreal Cognitive Assessment  12/05/2020 12/07/2019  Visuospatial/ Executive (0/5) 1 2  Naming (0/3) 3 3  Attention: Read list of digits (0/2) 2 2  Attention: Read list of letters (0/1) 1 1  Attention: Serial 7 subtraction starting at 100 (0/3) 0 0  Language: Repeat phrase (0/2) 1 2  Language : Fluency (0/1) 0 0  Abstraction (0/2) 2 1  Delayed Recall (0/5) 0 0  Orientation (0/6) 2 6  Total 12 17      CRANIAL NERVES: CN II: Visual fields are full to confrontation.  Pupils are round  equal and briskly reactive to light. CN III, IV, VI: extraocular movement are normal. No ptosis. CN V: Facial sensation is intact to light touch. CN VII: Face is symmetric with normal eye closure and smile. CN VIII: Hearing is normal to casual conversation CN IX, X: Palate elevates symmetrically. Phonation is normal. CN XI: Head turning and shoulder shrug are intact  MOTOR: Muscle bulk and tone are normal. Muscle strength is normal.  REFLEXES: Reflexes are hypoactive and symmetric at the biceps, triceps, knees and ankles. Plantar responses are flexor.  SENSORY: Intact to light touch,   COORDINATION: There is no trunk or limb ataxia.    GAIT/STANCE: She needs to push-up to get up from seated position, scoliosis, mildly unsteady  DIAGNOSTIC DATA (LABS, IMAGING, TESTING) - I reviewed patient records, labs, notes, testing and imaging myself where available.  Lab Results  Component Value Date   WBC 6.1 11/12/2018   HGB 14.1 11/12/2018   HCT 42.1 11/12/2018   MCV 97 11/12/2018   PLT 251 11/12/2013      Component Value Date/Time   NA 138 11/12/2018 1130   K 4.5 11/12/2018 1130   CL 102 11/12/2018 1130   CO2 20 11/12/2018 1130   GLUCOSE 136 (H) 11/12/2018 1130   GLUCOSE 110 (H) 11/12/2013 0544   BUN 22 11/12/2018 1130   CREATININE 0.78 11/12/2018 1130   CALCIUM 9.8 11/12/2018 1130   PROT 6.7 11/12/2018 1130   ALBUMIN 4.3 11/12/2018 1130   AST 25  11/12/2018 1130   ALT 18 11/12/2018 1130   ALKPHOS 80 11/12/2018 1130   BILITOT 0.8 11/12/2018 1130   GFRNONAA 72 11/12/2018 1130   GFRAA 82 11/12/2018 1130    ASSESSMENT AND PLAN  86 y.o. year old female   Multiple sclerosis since 1980s,  History of optic neuritis, spinal cord involvement, gait abnormality,  Never received long-term immunomodulation therapy,  There was no significant flareup over the years,   Gait abnormality  Combination of her MRI proven thoracic cord MS lesion, lumbar degenerative changes, deconditioning, aging, bilateral knee pain  Continue water aerobics, moderate exercise    Worsening Dementia  Personally reviewed MRI of the brain in November 2021, generalized atrophy, mild small vessel disease, no acute abnormality  Laboratory evaluation showed no treatable etiology,  MoCA examination is only 12 out of 30 today,  Developed agitations during the summertime in 2021, improved with Seroquel 25 mg from primary care physician, provided information of Namenda, Aricept for her daughter, she will MyChart back if she decided to start the medication,  Marcial Pacas, M.D. Ph.D.  [facility] [place], [place] [address] Phone: [phone] Fax:      [phone]

## 2020-12-05 NOTE — Patient Instructions (Addendum)
  Namenda Common Gastrointestinal: Constipation (3% to 5% ), Diarrhea (5% ), Vomiting (2% to 3% ) Neurologic: Confusion (6% ), Dizziness (5% to 7% ), Headache (adults, 6% ; pediatrics, 8% ) Serious Neurologic: Cerebrovascular accident, Seizure (up to 0.3% ) Renal: Acute renal failure Respiratory: Pneumonia  Common Aricept Gastrointestinal: Constipation (3% to 5% ), Diarrhea (5% ), Vomiting (2% to 3% ) Neurologic: Confusion (6% ), Dizziness (5% to 7% ), Headache (adults, 6% ; pediatrics, 8% ) Serious Neurologic: Cerebrovascular accident, Seizure (up to 0.3% ) Renal: Acute renal failure Respiratory: Pneumonia

## 2020-12-08 DIAGNOSIS — Z20822 Contact with and (suspected) exposure to covid-19: Secondary | ICD-10-CM | POA: Diagnosis not present

## 2020-12-19 DIAGNOSIS — M1712 Unilateral primary osteoarthritis, left knee: Secondary | ICD-10-CM | POA: Diagnosis not present

## 2020-12-26 DIAGNOSIS — M1712 Unilateral primary osteoarthritis, left knee: Secondary | ICD-10-CM | POA: Diagnosis not present

## 2021-01-10 DIAGNOSIS — M1712 Unilateral primary osteoarthritis, left knee: Secondary | ICD-10-CM | POA: Diagnosis not present

## 2021-01-10 DIAGNOSIS — F0390 Unspecified dementia without behavioral disturbance: Secondary | ICD-10-CM | POA: Diagnosis not present

## 2021-01-10 DIAGNOSIS — R350 Frequency of micturition: Secondary | ICD-10-CM | POA: Diagnosis not present

## 2021-01-10 DIAGNOSIS — N390 Urinary tract infection, site not specified: Secondary | ICD-10-CM | POA: Diagnosis not present

## 2021-01-30 DIAGNOSIS — R351 Nocturia: Secondary | ICD-10-CM | POA: Diagnosis not present

## 2021-01-30 DIAGNOSIS — N3941 Urge incontinence: Secondary | ICD-10-CM | POA: Diagnosis not present

## 2021-03-07 DIAGNOSIS — R350 Frequency of micturition: Secondary | ICD-10-CM | POA: Diagnosis not present

## 2021-03-07 DIAGNOSIS — N3941 Urge incontinence: Secondary | ICD-10-CM | POA: Diagnosis not present

## 2021-03-27 DIAGNOSIS — L814 Other melanin hyperpigmentation: Secondary | ICD-10-CM | POA: Diagnosis not present

## 2021-03-27 DIAGNOSIS — L821 Other seborrheic keratosis: Secondary | ICD-10-CM | POA: Diagnosis not present

## 2021-03-27 DIAGNOSIS — R21 Rash and other nonspecific skin eruption: Secondary | ICD-10-CM | POA: Diagnosis not present

## 2021-03-29 DIAGNOSIS — Z20822 Contact with and (suspected) exposure to covid-19: Secondary | ICD-10-CM | POA: Diagnosis not present

## 2021-04-02 DIAGNOSIS — M1712 Unilateral primary osteoarthritis, left knee: Secondary | ICD-10-CM | POA: Diagnosis not present

## 2021-04-20 DIAGNOSIS — G35 Multiple sclerosis: Secondary | ICD-10-CM | POA: Diagnosis not present

## 2021-04-20 DIAGNOSIS — M6281 Muscle weakness (generalized): Secondary | ICD-10-CM | POA: Diagnosis not present

## 2021-04-20 DIAGNOSIS — R2681 Unsteadiness on feet: Secondary | ICD-10-CM | POA: Diagnosis not present

## 2021-04-20 DIAGNOSIS — R2689 Other abnormalities of gait and mobility: Secondary | ICD-10-CM | POA: Diagnosis not present

## 2021-04-23 DIAGNOSIS — Z20822 Contact with and (suspected) exposure to covid-19: Secondary | ICD-10-CM | POA: Diagnosis not present

## 2021-04-24 DIAGNOSIS — F03B3 Unspecified dementia, moderate, with mood disturbance: Secondary | ICD-10-CM | POA: Diagnosis not present

## 2021-04-24 DIAGNOSIS — L570 Actinic keratosis: Secondary | ICD-10-CM | POA: Diagnosis not present

## 2021-04-24 DIAGNOSIS — M6281 Muscle weakness (generalized): Secondary | ICD-10-CM | POA: Diagnosis not present

## 2021-04-24 DIAGNOSIS — R2681 Unsteadiness on feet: Secondary | ICD-10-CM | POA: Diagnosis not present

## 2021-04-24 DIAGNOSIS — L814 Other melanin hyperpigmentation: Secondary | ICD-10-CM | POA: Diagnosis not present

## 2021-04-24 DIAGNOSIS — L821 Other seborrheic keratosis: Secondary | ICD-10-CM | POA: Diagnosis not present

## 2021-04-24 DIAGNOSIS — R41841 Cognitive communication deficit: Secondary | ICD-10-CM | POA: Diagnosis not present

## 2021-04-24 DIAGNOSIS — R21 Rash and other nonspecific skin eruption: Secondary | ICD-10-CM | POA: Diagnosis not present

## 2021-04-24 DIAGNOSIS — R2689 Other abnormalities of gait and mobility: Secondary | ICD-10-CM | POA: Diagnosis not present

## 2021-04-24 DIAGNOSIS — G35 Multiple sclerosis: Secondary | ICD-10-CM | POA: Diagnosis not present

## 2021-04-24 DIAGNOSIS — D229 Melanocytic nevi, unspecified: Secondary | ICD-10-CM | POA: Diagnosis not present

## 2021-04-25 DIAGNOSIS — M6281 Muscle weakness (generalized): Secondary | ICD-10-CM | POA: Diagnosis not present

## 2021-04-25 DIAGNOSIS — R2689 Other abnormalities of gait and mobility: Secondary | ICD-10-CM | POA: Diagnosis not present

## 2021-04-25 DIAGNOSIS — R2681 Unsteadiness on feet: Secondary | ICD-10-CM | POA: Diagnosis not present

## 2021-04-25 DIAGNOSIS — R41841 Cognitive communication deficit: Secondary | ICD-10-CM | POA: Diagnosis not present

## 2021-04-25 DIAGNOSIS — G35 Multiple sclerosis: Secondary | ICD-10-CM | POA: Diagnosis not present

## 2021-04-25 DIAGNOSIS — F03B3 Unspecified dementia, moderate, with mood disturbance: Secondary | ICD-10-CM | POA: Diagnosis not present

## 2021-04-27 DIAGNOSIS — R41841 Cognitive communication deficit: Secondary | ICD-10-CM | POA: Diagnosis not present

## 2021-04-27 DIAGNOSIS — R2689 Other abnormalities of gait and mobility: Secondary | ICD-10-CM | POA: Diagnosis not present

## 2021-04-27 DIAGNOSIS — M6281 Muscle weakness (generalized): Secondary | ICD-10-CM | POA: Diagnosis not present

## 2021-04-27 DIAGNOSIS — G35 Multiple sclerosis: Secondary | ICD-10-CM | POA: Diagnosis not present

## 2021-04-27 DIAGNOSIS — R2681 Unsteadiness on feet: Secondary | ICD-10-CM | POA: Diagnosis not present

## 2021-04-27 DIAGNOSIS — F03B3 Unspecified dementia, moderate, with mood disturbance: Secondary | ICD-10-CM | POA: Diagnosis not present

## 2021-05-01 DIAGNOSIS — R2681 Unsteadiness on feet: Secondary | ICD-10-CM | POA: Diagnosis not present

## 2021-05-01 DIAGNOSIS — F03B3 Unspecified dementia, moderate, with mood disturbance: Secondary | ICD-10-CM | POA: Diagnosis not present

## 2021-05-01 DIAGNOSIS — G35 Multiple sclerosis: Secondary | ICD-10-CM | POA: Diagnosis not present

## 2021-05-01 DIAGNOSIS — M6281 Muscle weakness (generalized): Secondary | ICD-10-CM | POA: Diagnosis not present

## 2021-05-01 DIAGNOSIS — R41841 Cognitive communication deficit: Secondary | ICD-10-CM | POA: Diagnosis not present

## 2021-05-01 DIAGNOSIS — R2689 Other abnormalities of gait and mobility: Secondary | ICD-10-CM | POA: Diagnosis not present

## 2021-05-02 DIAGNOSIS — R2689 Other abnormalities of gait and mobility: Secondary | ICD-10-CM | POA: Diagnosis not present

## 2021-05-02 DIAGNOSIS — M6281 Muscle weakness (generalized): Secondary | ICD-10-CM | POA: Diagnosis not present

## 2021-05-02 DIAGNOSIS — F03B3 Unspecified dementia, moderate, with mood disturbance: Secondary | ICD-10-CM | POA: Diagnosis not present

## 2021-05-02 DIAGNOSIS — R41841 Cognitive communication deficit: Secondary | ICD-10-CM | POA: Diagnosis not present

## 2021-05-02 DIAGNOSIS — R2681 Unsteadiness on feet: Secondary | ICD-10-CM | POA: Diagnosis not present

## 2021-05-02 DIAGNOSIS — G35 Multiple sclerosis: Secondary | ICD-10-CM | POA: Diagnosis not present

## 2021-05-07 DIAGNOSIS — R41841 Cognitive communication deficit: Secondary | ICD-10-CM | POA: Diagnosis not present

## 2021-05-07 DIAGNOSIS — F03B3 Unspecified dementia, moderate, with mood disturbance: Secondary | ICD-10-CM | POA: Diagnosis not present

## 2021-05-07 DIAGNOSIS — M6281 Muscle weakness (generalized): Secondary | ICD-10-CM | POA: Diagnosis not present

## 2021-05-07 DIAGNOSIS — R2689 Other abnormalities of gait and mobility: Secondary | ICD-10-CM | POA: Diagnosis not present

## 2021-05-07 DIAGNOSIS — R2681 Unsteadiness on feet: Secondary | ICD-10-CM | POA: Diagnosis not present

## 2021-05-07 DIAGNOSIS — G35 Multiple sclerosis: Secondary | ICD-10-CM | POA: Diagnosis not present

## 2021-05-08 DIAGNOSIS — G35 Multiple sclerosis: Secondary | ICD-10-CM | POA: Diagnosis not present

## 2021-05-08 DIAGNOSIS — R2681 Unsteadiness on feet: Secondary | ICD-10-CM | POA: Diagnosis not present

## 2021-05-08 DIAGNOSIS — R41841 Cognitive communication deficit: Secondary | ICD-10-CM | POA: Diagnosis not present

## 2021-05-08 DIAGNOSIS — R2689 Other abnormalities of gait and mobility: Secondary | ICD-10-CM | POA: Diagnosis not present

## 2021-05-08 DIAGNOSIS — F03B3 Unspecified dementia, moderate, with mood disturbance: Secondary | ICD-10-CM | POA: Diagnosis not present

## 2021-05-08 DIAGNOSIS — M6281 Muscle weakness (generalized): Secondary | ICD-10-CM | POA: Diagnosis not present

## 2021-05-09 DIAGNOSIS — R2689 Other abnormalities of gait and mobility: Secondary | ICD-10-CM | POA: Diagnosis not present

## 2021-05-09 DIAGNOSIS — G35 Multiple sclerosis: Secondary | ICD-10-CM | POA: Diagnosis not present

## 2021-05-09 DIAGNOSIS — R41841 Cognitive communication deficit: Secondary | ICD-10-CM | POA: Diagnosis not present

## 2021-05-09 DIAGNOSIS — Z20822 Contact with and (suspected) exposure to covid-19: Secondary | ICD-10-CM | POA: Diagnosis not present

## 2021-05-09 DIAGNOSIS — R2681 Unsteadiness on feet: Secondary | ICD-10-CM | POA: Diagnosis not present

## 2021-05-09 DIAGNOSIS — M6281 Muscle weakness (generalized): Secondary | ICD-10-CM | POA: Diagnosis not present

## 2021-05-09 DIAGNOSIS — F03B3 Unspecified dementia, moderate, with mood disturbance: Secondary | ICD-10-CM | POA: Diagnosis not present

## 2021-05-11 DIAGNOSIS — G35 Multiple sclerosis: Secondary | ICD-10-CM | POA: Diagnosis not present

## 2021-05-11 DIAGNOSIS — R2689 Other abnormalities of gait and mobility: Secondary | ICD-10-CM | POA: Diagnosis not present

## 2021-05-11 DIAGNOSIS — F03B3 Unspecified dementia, moderate, with mood disturbance: Secondary | ICD-10-CM | POA: Diagnosis not present

## 2021-05-11 DIAGNOSIS — R2681 Unsteadiness on feet: Secondary | ICD-10-CM | POA: Diagnosis not present

## 2021-05-11 DIAGNOSIS — M6281 Muscle weakness (generalized): Secondary | ICD-10-CM | POA: Diagnosis not present

## 2021-05-11 DIAGNOSIS — R41841 Cognitive communication deficit: Secondary | ICD-10-CM | POA: Diagnosis not present

## 2021-05-16 DIAGNOSIS — Z20822 Contact with and (suspected) exposure to covid-19: Secondary | ICD-10-CM | POA: Diagnosis not present

## 2021-05-16 DIAGNOSIS — F0390 Unspecified dementia without behavioral disturbance: Secondary | ICD-10-CM | POA: Diagnosis not present

## 2021-05-18 DIAGNOSIS — G35 Multiple sclerosis: Secondary | ICD-10-CM | POA: Diagnosis not present

## 2021-05-18 DIAGNOSIS — F03B3 Unspecified dementia, moderate, with mood disturbance: Secondary | ICD-10-CM | POA: Diagnosis not present

## 2021-05-18 DIAGNOSIS — M6281 Muscle weakness (generalized): Secondary | ICD-10-CM | POA: Diagnosis not present

## 2021-05-18 DIAGNOSIS — R2681 Unsteadiness on feet: Secondary | ICD-10-CM | POA: Diagnosis not present

## 2021-05-18 DIAGNOSIS — R2689 Other abnormalities of gait and mobility: Secondary | ICD-10-CM | POA: Diagnosis not present

## 2021-05-18 DIAGNOSIS — R41841 Cognitive communication deficit: Secondary | ICD-10-CM | POA: Diagnosis not present

## 2021-05-22 DIAGNOSIS — D229 Melanocytic nevi, unspecified: Secondary | ICD-10-CM | POA: Diagnosis not present

## 2021-05-22 DIAGNOSIS — R21 Rash and other nonspecific skin eruption: Secondary | ICD-10-CM | POA: Diagnosis not present

## 2021-05-22 DIAGNOSIS — L570 Actinic keratosis: Secondary | ICD-10-CM | POA: Diagnosis not present

## 2021-05-22 DIAGNOSIS — L814 Other melanin hyperpigmentation: Secondary | ICD-10-CM | POA: Diagnosis not present

## 2021-05-22 DIAGNOSIS — L821 Other seborrheic keratosis: Secondary | ICD-10-CM | POA: Diagnosis not present

## 2021-05-24 DIAGNOSIS — R41841 Cognitive communication deficit: Secondary | ICD-10-CM | POA: Diagnosis not present

## 2021-05-24 DIAGNOSIS — F03B3 Unspecified dementia, moderate, with mood disturbance: Secondary | ICD-10-CM | POA: Diagnosis not present

## 2021-05-28 DIAGNOSIS — R41841 Cognitive communication deficit: Secondary | ICD-10-CM | POA: Diagnosis not present

## 2021-05-28 DIAGNOSIS — F03B3 Unspecified dementia, moderate, with mood disturbance: Secondary | ICD-10-CM | POA: Diagnosis not present

## 2021-05-31 DIAGNOSIS — F03B3 Unspecified dementia, moderate, with mood disturbance: Secondary | ICD-10-CM | POA: Diagnosis not present

## 2021-05-31 DIAGNOSIS — R41841 Cognitive communication deficit: Secondary | ICD-10-CM | POA: Diagnosis not present

## 2021-06-12 DIAGNOSIS — Z7982 Long term (current) use of aspirin: Secondary | ICD-10-CM | POA: Diagnosis not present

## 2021-06-12 DIAGNOSIS — I10 Essential (primary) hypertension: Secondary | ICD-10-CM | POA: Diagnosis not present

## 2021-06-12 DIAGNOSIS — E7801 Familial hypercholesterolemia: Secondary | ICD-10-CM | POA: Diagnosis not present

## 2021-06-19 DIAGNOSIS — Z7982 Long term (current) use of aspirin: Secondary | ICD-10-CM | POA: Diagnosis not present

## 2021-06-19 DIAGNOSIS — Z0000 Encounter for general adult medical examination without abnormal findings: Secondary | ICD-10-CM | POA: Diagnosis not present

## 2021-06-19 DIAGNOSIS — M810 Age-related osteoporosis without current pathological fracture: Secondary | ICD-10-CM | POA: Diagnosis not present

## 2021-06-19 DIAGNOSIS — I10 Essential (primary) hypertension: Secondary | ICD-10-CM | POA: Diagnosis not present

## 2021-06-19 DIAGNOSIS — H5702 Anisocoria: Secondary | ICD-10-CM | POA: Diagnosis not present

## 2021-06-19 DIAGNOSIS — G35 Multiple sclerosis: Secondary | ICD-10-CM | POA: Diagnosis not present

## 2021-06-19 DIAGNOSIS — E7801 Familial hypercholesterolemia: Secondary | ICD-10-CM | POA: Diagnosis not present

## 2021-06-19 DIAGNOSIS — F03911 Unspecified dementia, unspecified severity, with agitation: Secondary | ICD-10-CM | POA: Diagnosis not present

## 2021-06-19 DIAGNOSIS — L821 Other seborrheic keratosis: Secondary | ICD-10-CM | POA: Diagnosis not present

## 2021-06-19 DIAGNOSIS — K219 Gastro-esophageal reflux disease without esophagitis: Secondary | ICD-10-CM | POA: Diagnosis not present

## 2021-06-19 DIAGNOSIS — L570 Actinic keratosis: Secondary | ICD-10-CM | POA: Diagnosis not present

## 2021-06-19 DIAGNOSIS — R350 Frequency of micturition: Secondary | ICD-10-CM | POA: Diagnosis not present

## 2021-08-06 DIAGNOSIS — M1712 Unilateral primary osteoarthritis, left knee: Secondary | ICD-10-CM | POA: Diagnosis not present

## 2021-08-13 DIAGNOSIS — M1712 Unilateral primary osteoarthritis, left knee: Secondary | ICD-10-CM | POA: Diagnosis not present

## 2021-08-14 DIAGNOSIS — H5202 Hypermetropia, left eye: Secondary | ICD-10-CM | POA: Diagnosis not present

## 2021-08-14 DIAGNOSIS — H5211 Myopia, right eye: Secondary | ICD-10-CM | POA: Diagnosis not present

## 2021-08-14 DIAGNOSIS — Z961 Presence of intraocular lens: Secondary | ICD-10-CM | POA: Diagnosis not present

## 2021-08-14 DIAGNOSIS — H52223 Regular astigmatism, bilateral: Secondary | ICD-10-CM | POA: Diagnosis not present

## 2021-08-14 DIAGNOSIS — H524 Presbyopia: Secondary | ICD-10-CM | POA: Diagnosis not present

## 2021-08-14 DIAGNOSIS — H26492 Other secondary cataract, left eye: Secondary | ICD-10-CM | POA: Diagnosis not present

## 2021-08-14 DIAGNOSIS — H35362 Drusen (degenerative) of macula, left eye: Secondary | ICD-10-CM | POA: Diagnosis not present

## 2021-08-15 DIAGNOSIS — H93293 Other abnormal auditory perceptions, bilateral: Secondary | ICD-10-CM | POA: Diagnosis not present

## 2021-08-15 DIAGNOSIS — H6123 Impacted cerumen, bilateral: Secondary | ICD-10-CM | POA: Diagnosis not present

## 2021-08-15 DIAGNOSIS — H903 Sensorineural hearing loss, bilateral: Secondary | ICD-10-CM | POA: Diagnosis not present

## 2021-10-11 DIAGNOSIS — I781 Nevus, non-neoplastic: Secondary | ICD-10-CM | POA: Diagnosis not present

## 2021-10-11 DIAGNOSIS — D171 Benign lipomatous neoplasm of skin and subcutaneous tissue of trunk: Secondary | ICD-10-CM | POA: Diagnosis not present

## 2021-10-11 DIAGNOSIS — L821 Other seborrheic keratosis: Secondary | ICD-10-CM | POA: Diagnosis not present

## 2021-10-11 DIAGNOSIS — D1801 Hemangioma of skin and subcutaneous tissue: Secondary | ICD-10-CM | POA: Diagnosis not present

## 2021-10-11 DIAGNOSIS — L578 Other skin changes due to chronic exposure to nonionizing radiation: Secondary | ICD-10-CM | POA: Diagnosis not present

## 2021-10-30 DIAGNOSIS — F03918 Unspecified dementia, unspecified severity, with other behavioral disturbance: Secondary | ICD-10-CM | POA: Diagnosis not present

## 2021-10-30 DIAGNOSIS — M810 Age-related osteoporosis without current pathological fracture: Secondary | ICD-10-CM | POA: Diagnosis not present

## 2021-10-30 DIAGNOSIS — Z23 Encounter for immunization: Secondary | ICD-10-CM | POA: Diagnosis not present

## 2021-11-13 DIAGNOSIS — M1712 Unilateral primary osteoarthritis, left knee: Secondary | ICD-10-CM | POA: Diagnosis not present

## 2021-12-10 NOTE — Progress Notes (Unsigned)
Patient: Jennifer Atkins Date of Birth: [birth year]  Reason for Visit: Follow up History from: Patient, daughter Primary Neurologist: Krista Blue  ASSESSMENT AND PLAN 88 y.o. year old female   1.  Multiple sclerosis since 1980's 2.  Gait abnormality -Never treated with long-term immunomodulation therapy -Historically has been very stable, history of optic neuritis, spinal cord involvement, gait abnormality  3.  Dementia -MoCA in November 2022 was 12/30 -Memory continues to decline gradually -Not interested in Aricept or Namenda -On Seroquel 125 mg at bedtime from PCP, mentioned may benefit from facility psychiatric consultation if needed -She does not enjoy going out for appointments, will return here for new or worsening symptoms  HISTORY  Jennifer Atkins is a 87 year old right-handed Caucasian female, followup for multiple sclerosis, her primary care physician is [facility] Dr. Shelia Media, last clinical visit was with Hoyle Sauer in May 2014   She had past medical history of relapsing remitting multiple sclerosis, was patient of Dr. Erling Cruz for many years, she had gait abnormality, she had right optic neuritis with symptoms beginning in 1980, with total recovery within one month after po steroid treatment, recurrent right optic neuritis again a year later.     Over the years, she has developed gradual onset gait difficulty, especially since 2014, also fatigue, mild memory loss,    She has never been on any immunomodulating medications.    She was highly functional, driving, water aerobic, golf regularly, she has urinary urgency, nocturia, taking vesicare, occasionally bladder incontinence when she gets up from a seated position   She fell in Jan 2015, went to ED, had left forehead laceration, previously, she fell in August 2014 at [facility].    She was referred to physical therapy afterwards, machine exercise has made her right leg worse, right foot pain.   She now complains of  worsening right foot numbness, right knee pain, has right meniscus repair in the past, right leg numbness, weakness, she also has mild left foot numbness.    UPDATE July 01 2013:  She fell again on June 10th 2015, without warning signs, now with right thumb pain, now she has right foot numbness, multiple falling episodes over past one year. We have reviewed MRI of the brain, mild atrophy, scattered periventricular white matter disease, small vessel disease vs. MS lesions,   MRI thoracic spine,Subtle T2 hyperintensity at T6-7 level, may represent chronic demyelinating plaque. No acute plaques are seen.   MRI lumbar spine (without) demonstrating multilevel degenerative disc disease, most severe at L5-S1, disc bulging and facet hypertrophy with moderate right and mild left foraminal stenosis; potential impingement upon the right L5 and descending bilateral S1 roots. L3-4: disc bulging and facet hypertrophy with mild right and moderate left foraminal stenosis.  L4-5: disc bulging and facet hypertrophy with moderate right and mild left foraminal stenosis    MRI cervical:C4-5: disc bulging with mild biforaminal stenosis. C5-6. C6-7: disc bulging and uncovertebral joint hypertrophy with mild biforaminal stenosis. No intrinsic or compressive spinal cord lesions. No abnormal enhancing lesions.   UPDATE July 11 2014: She continues to do very well, exercise regularly, never was treated with immunomodulation therapy, she had right knee replacement October 2015, recovered very well. She complains of worsening bowel and bladder urgency,occasionally incontinence,   She complains of lack of stamina,   UPDATE June 19th 2017: She still has right knee pain despite previous right knee replacement,, also left knee pain, she still exercise regularly, water aerobic, which has been helpful, she does  not [place], she has more right foot numbness, she does not feel confidence driving on the high way.  She still  plays golf, she has six children, she will visit her son, she still lives in her house.   UPDATE September 04 2016: She is with her daughter at visit today, she complains of left hip pain, radiating pain to left leg since early August 2018, she was seen by orthopedic, was given a course of po steroid, which helps her some.   We have personally reviewed MRI seen 2015,MRI of the lumbar region showed multilevel significant degenerative changes, most severe at L5-S1, bulging disc and facet hypertrophy, moderate right, mild left foraminal narrowing, potential impingement on the right L5 nerve roots,   She also complains of slow worsening memory loss   UPDATE Nov 12 2018: She complains of increased fatigue, urinary incontinence, occasionally bowel incontinence, she was not able to for 6 months, now she is able to do water aerobic again, she also complains of mild memory loss, Personally reviewed MRIs in 2015, MRI of brain generalized atrophy, few supratentorium T2/FLAIR hyperdensity lesion, MRI of cervical spine, multilevel degenerative changes, no cord lesion.  MRI of thoracic spine subtle T2 hyperintensity at the T6-7 level, no acute plaque.  MRI of lumbar multilevel degenerative changes, moderate right foraminal stenosis at L5-S1, potentially impingement of the right L5 in the descending bilateral S1 nerve roots, moderate right foraminal stenosis at L4-5.   UPDATE Dec 07 2019: She moved to independent living since July 2021, because of worsening functional status, become more forgetful, difficult to manage her own medication, slow worsening gait abnormality, she continue to exercise regularly, sleeps well.  She is accompanied by her 2 daughters at today's clinical visit, who is more concerned about her mother's processing speed, difficulty managing her medications, denies significant difficulties. MoCA examination 17 out of 30 today   We also personally reviewed MRI of the brain with and without contrast  in November 2021: No acute abnormality, mild to moderate generalized atrophy, supratentorium small vessel disease, mild progression compared to previous MRI in 2015   Laboratory evaluations in May 2021: Normal CBC hemoglobin of 14.3, CMP, triglyceride 168, LDL 143,   UPDATE Dec 05 2020: She is is accompanied by her daughter at today's visit, she lives at independent living at [facility], trying to participate water aerobic, does it once a week, she sleeps well, has good appetite, her knee hurt her sometimes, she has no trouble using bathroom. She is now ambulate with walker, for longer distance, receiving left knee injection  The main concern is worsening short-term memory loss, developed agitation hallucination during the summertime, was given Seroquel 25 mg every night, which has been helpful  Update December 11, 2021 SS: Living in the memory care unit at [place] since in August, prior was in [place] form Jan 2023. Memory declines, does her own dressing, eating, sleeps well 125 mg at bedtime, does increased in Sept due to delusions, agitations. Comes from PCP Dr. Shelia Media. Plays bingo, not interested in Namenda/Aricept. Has rolling walker, no recent falls. She has no concerns, right now things are going well. Is overall cooperative, pleasant.   REVIEW OF SYSTEMS: Out of a complete 14 system review of symptoms, the patient complains only of the following symptoms, and all other reviewed systems are negative.  See HPI  ALLERGIES: Allergies  Allergen Reactions   Latex Rash    Band-aids     HOME MEDICATIONS: Outpatient Medications Prior to  Visit  Medication Sig Dispense Refill   Acetaminophen (TYLENOL ARTHRITIS PAIN PO) Take 2 tablets by mouth daily.      amLODipine (NORVASC) 2.5 MG tablet Take 2.5 mg by mouth daily.     calcium carbonate (TUMS - DOSED IN MG ELEMENTAL CALCIUM) 500 MG chewable tablet Chew 2 tablets by mouth 2 (two) times daily.     Glucosamine-Chondroitin (GLUCOSAMINE CHONDR  COMPLEX PO) Take by mouth 2 (two) times daily.     Multiple Vitamins-Minerals (MULTIVITAMIN PO) Take 1 tablet by mouth daily.      omeprazole (PRILOSEC) 20 MG capsule Take 20 mg by mouth 2 (two) times daily before a meal.     QUEtiapine Fumarate (SEROQUEL PO) Take 125 mg by mouth.     Trospium Chloride 60 MG CP24 Take 1 capsule by mouth daily.     valsartan (DIOVAN) 320 MG tablet Take 320 mg by mouth daily.     VITAMIN D, CHOLECALCIFEROL, PO Take 1,000 Units by mouth 2 (two) times daily.      QUEtiapine (SEROQUEL) 25 MG tablet 1 tablet at bedtime. (Patient not taking: Reported on 12/11/2021)     RESTASIS 0.05 % ophthalmic emulsion Place 1 drop into both eyes 2 (two) times daily.  (Patient not taking: Reported on 12/11/2021)     No facility-administered medications prior to visit.    PAST MEDICAL HISTORY: Past Medical History:  Diagnosis Date   Arthritis    "qwhere"   Hypertension    Memory loss    "from [place] & old age; I guess" (11/11/2013)   MS (multiple sclerosis) ([place])    Optic neuritis    Urinary urgency    Vertigo     PAST SURGICAL HISTORY: Past Surgical History:  Procedure Laterality Date   BREAST BIOPSY Right 10/17/2017   fibrocystic changes   CATARACT EXTRACTION W/ INTRAOCULAR LENS  IMPLANT, BILATERAL Bilateral 2013   COLECTOMY  1980's   S/P colonoscopy   JOINT REPLACEMENT     KNEE ARTHROSCOPY Right 1994   "from staph infection"   KNEE ARTHROSCOPY  2012   "meniscus repair"   TONSILLECTOMY  ~ [place] Right 11/10/2013   TOTAL KNEE ARTHROPLASTY Right 11/10/2013   Procedure: RIGHT TOTAL KNEE ARTHROPLASTY;  Surgeon: Ninetta Lights, MD;  Location: [facility];  Service: Orthopedics;  Laterality: Right;   VAGINAL HYSTERECTOMY  1973    FAMILY HISTORY: Family History  Problem Relation Age of Onset   Cancer Father    Leukemia Mother    Heart disease Other    Heart disease Maternal Grandmother    Breast cancer Maternal Aunt     SOCIAL  HISTORY: Social History   Socioeconomic History   Marital status: Widowed    Spouse name: Not on file   Number of children: 6   Years of education: College   Highest education level: Not on file  Occupational History    Comment: Retired  Tobacco Use   Smoking status: Never   Smokeless tobacco: Never  Substance and Sexual Activity   Alcohol use: Yes    Alcohol/week: 7.0 standard drinks of alcohol    Types: 7 Glasses of wine per week   Drug use: No   Sexual activity: Never  Other Topics Concern   Not on file  Social History Narrative   Patient lives at [place], [place].   Right handed   Drinks tea in the morning, wine at night.   Social Determinants of Health  Financial Resource Strain: Not on file  Food Insecurity: Not on file  Transportation Needs: Not on file  Physical Activity: Not on file  Stress: Not on file  Social Connections: Not on file  Intimate Partner Violence: Not on file    PHYSICAL EXAM  Vitals:   12/11/21 1043  BP: 129/67  Pulse: 69  Weight: 137 lb (62.1 kg)  Height: '5\' 5"'$  (1.651 m)   Body mass index is 22.8 kg/m.  Generalized: Well developed, in no acute distress, elderly female  Neurological examination  Mentation: Alert, history is mostly by her daughter, she is alert to self, place, DOB, repeats often "I don't remember", speech is clear, follows exam commands well Cranial nerve II-XII: Pupils were equal round reactive to light. Extraocular movements were full, visual field were full on confrontational test. Facial sensation and strength were normal. Head turning and shoulder shrug  were normal and symmetric. Motor: Good Strength overall Sensory: Sensory testing is intact to soft touch on all 4 extremities. No evidence of extinction is noted.  Coordination: Cerebellar testing reveals good finger-nose-finger and heel-to-shin bilaterally.  Gait and station: Has to push off from seated position to stand, gait is wide-based,  cautious, uses rolling walker Reflexes: Deep tendon reflexes are symmetric and normal bilaterally.   DIAGNOSTIC DATA (LABS, IMAGING, TESTING) - I reviewed patient records, labs, notes, testing and imaging myself where available.  Lab Results  Component Value Date   WBC 6.1 11/12/2018   HGB 14.1 11/12/2018   HCT 42.1 11/12/2018   MCV 97 11/12/2018   PLT 251 11/12/2013      Component Value Date/Time   NA 138 11/12/2018 1130   K 4.5 11/12/2018 1130   CL 102 11/12/2018 1130   CO2 20 11/12/2018 1130   GLUCOSE 136 (H) 11/12/2018 1130   GLUCOSE 110 (H) 11/12/2013 0544   BUN 22 11/12/2018 1130   CREATININE 0.78 11/12/2018 1130   CALCIUM 9.8 11/12/2018 1130   PROT 6.7 11/12/2018 1130   ALBUMIN 4.3 11/12/2018 1130   AST 25 11/12/2018 1130   ALT 18 11/12/2018 1130   ALKPHOS 80 11/12/2018 1130   BILITOT 0.8 11/12/2018 1130   GFRNONAA 72 11/12/2018 1130   GFRAA 82 11/12/2018 1130   No results found for: "CHOL", "HDL", "LDLCALC", "LDLDIRECT", "TRIG", "CHOLHDL" No results found for: "HGBA1C" Lab Results  Component Value Date   RFXJOITG54 982 11/12/2018   Lab Results  Component Value Date   TSH 0.663 11/12/2018    Butler Denmark, AGNP-C, DNP 12/11/2021, 11:13 AM [facility] [address] [place], [place] [address] [phone]

## 2021-12-11 ENCOUNTER — Ambulatory Visit (INDEPENDENT_AMBULATORY_CARE_PROVIDER_SITE_OTHER): Payer: Medicare Other | Admitting: Neurology

## 2021-12-11 ENCOUNTER — Encounter: Payer: Self-pay | Admitting: Neurology

## 2021-12-11 VITALS — BP 129/67 | HR 69 | Ht 65.0 in | Wt 137.0 lb

## 2021-12-11 DIAGNOSIS — G35 Multiple sclerosis: Secondary | ICD-10-CM

## 2021-12-11 DIAGNOSIS — F03B3 Unspecified dementia, moderate, with mood disturbance: Secondary | ICD-10-CM | POA: Diagnosis not present

## 2021-12-11 NOTE — Progress Notes (Signed)
Chart reviewed, agree above plan ?

## 2021-12-11 NOTE — Patient Instructions (Signed)
May consider consultation with psychiatry in the facility for mood management if needed Return here as needed, call for any new or worsening symptoms  Continue follow up with your primary care doctor

## 2021-12-25 DIAGNOSIS — L821 Other seborrheic keratosis: Secondary | ICD-10-CM | POA: Diagnosis not present

## 2021-12-25 DIAGNOSIS — D229 Melanocytic nevi, unspecified: Secondary | ICD-10-CM | POA: Diagnosis not present

## 2021-12-25 DIAGNOSIS — L814 Other melanin hyperpigmentation: Secondary | ICD-10-CM | POA: Diagnosis not present

## 2022-02-07 DIAGNOSIS — M810 Age-related osteoporosis without current pathological fracture: Secondary | ICD-10-CM | POA: Diagnosis not present

## 2022-02-19 DIAGNOSIS — M810 Age-related osteoporosis without current pathological fracture: Secondary | ICD-10-CM | POA: Diagnosis not present

## 2022-03-20 DIAGNOSIS — N3281 Overactive bladder: Secondary | ICD-10-CM | POA: Diagnosis not present

## 2022-03-20 DIAGNOSIS — R350 Frequency of micturition: Secondary | ICD-10-CM | POA: Diagnosis not present

## 2022-03-20 DIAGNOSIS — N3941 Urge incontinence: Secondary | ICD-10-CM | POA: Diagnosis not present

## 2022-03-21 DIAGNOSIS — M1712 Unilateral primary osteoarthritis, left knee: Secondary | ICD-10-CM | POA: Diagnosis not present

## 2022-03-26 DIAGNOSIS — F22 Delusional disorders: Secondary | ICD-10-CM | POA: Diagnosis not present

## 2022-03-26 DIAGNOSIS — I10 Essential (primary) hypertension: Secondary | ICD-10-CM | POA: Diagnosis not present

## 2022-03-28 DIAGNOSIS — M1712 Unilateral primary osteoarthritis, left knee: Secondary | ICD-10-CM | POA: Diagnosis not present

## 2022-04-04 DIAGNOSIS — M1712 Unilateral primary osteoarthritis, left knee: Secondary | ICD-10-CM | POA: Diagnosis not present

## 2022-07-03 DIAGNOSIS — I10 Essential (primary) hypertension: Secondary | ICD-10-CM | POA: Diagnosis not present

## 2022-07-04 DIAGNOSIS — M1712 Unilateral primary osteoarthritis, left knee: Secondary | ICD-10-CM | POA: Diagnosis not present

## 2022-07-10 DIAGNOSIS — M810 Age-related osteoporosis without current pathological fracture: Secondary | ICD-10-CM | POA: Diagnosis not present

## 2022-07-10 DIAGNOSIS — R350 Frequency of micturition: Secondary | ICD-10-CM | POA: Diagnosis not present

## 2022-07-10 DIAGNOSIS — M8580 Other specified disorders of bone density and structure, unspecified site: Secondary | ICD-10-CM | POA: Diagnosis not present

## 2022-07-10 DIAGNOSIS — Z8719 Personal history of other diseases of the digestive system: Secondary | ICD-10-CM | POA: Diagnosis not present

## 2022-07-10 DIAGNOSIS — E7800 Pure hypercholesterolemia, unspecified: Secondary | ICD-10-CM | POA: Diagnosis not present

## 2022-07-10 DIAGNOSIS — Z23 Encounter for immunization: Secondary | ICD-10-CM | POA: Diagnosis not present

## 2022-07-10 DIAGNOSIS — G35 Multiple sclerosis: Secondary | ICD-10-CM | POA: Diagnosis not present

## 2022-07-10 DIAGNOSIS — Z0000 Encounter for general adult medical examination without abnormal findings: Secondary | ICD-10-CM | POA: Diagnosis not present

## 2022-07-10 DIAGNOSIS — K219 Gastro-esophageal reflux disease without esophagitis: Secondary | ICD-10-CM | POA: Diagnosis not present

## 2022-07-10 DIAGNOSIS — I10 Essential (primary) hypertension: Secondary | ICD-10-CM | POA: Diagnosis not present

## 2022-07-11 DIAGNOSIS — R350 Frequency of micturition: Secondary | ICD-10-CM | POA: Diagnosis not present

## 2022-07-17 DIAGNOSIS — R2689 Other abnormalities of gait and mobility: Secondary | ICD-10-CM | POA: Diagnosis not present

## 2022-07-17 DIAGNOSIS — G35 Multiple sclerosis: Secondary | ICD-10-CM | POA: Diagnosis not present

## 2022-07-17 DIAGNOSIS — M6281 Muscle weakness (generalized): Secondary | ICD-10-CM | POA: Diagnosis not present

## 2022-07-17 DIAGNOSIS — M1380 Other specified arthritis, unspecified site: Secondary | ICD-10-CM | POA: Diagnosis not present

## 2022-07-17 DIAGNOSIS — F03B3 Unspecified dementia, moderate, with mood disturbance: Secondary | ICD-10-CM | POA: Diagnosis not present

## 2022-07-17 DIAGNOSIS — W19XXXD Unspecified fall, subsequent encounter: Secondary | ICD-10-CM | POA: Diagnosis not present

## 2022-07-18 DIAGNOSIS — W19XXXD Unspecified fall, subsequent encounter: Secondary | ICD-10-CM | POA: Diagnosis not present

## 2022-07-18 DIAGNOSIS — M1380 Other specified arthritis, unspecified site: Secondary | ICD-10-CM | POA: Diagnosis not present

## 2022-07-18 DIAGNOSIS — R2689 Other abnormalities of gait and mobility: Secondary | ICD-10-CM | POA: Diagnosis not present

## 2022-07-18 DIAGNOSIS — F03B3 Unspecified dementia, moderate, with mood disturbance: Secondary | ICD-10-CM | POA: Diagnosis not present

## 2022-07-18 DIAGNOSIS — G35 Multiple sclerosis: Secondary | ICD-10-CM | POA: Diagnosis not present

## 2022-07-18 DIAGNOSIS — M6281 Muscle weakness (generalized): Secondary | ICD-10-CM | POA: Diagnosis not present

## 2022-07-22 DIAGNOSIS — G35 Multiple sclerosis: Secondary | ICD-10-CM | POA: Diagnosis not present

## 2022-07-22 DIAGNOSIS — M1380 Other specified arthritis, unspecified site: Secondary | ICD-10-CM | POA: Diagnosis not present

## 2022-07-22 DIAGNOSIS — W19XXXD Unspecified fall, subsequent encounter: Secondary | ICD-10-CM | POA: Diagnosis not present

## 2022-07-22 DIAGNOSIS — F03B3 Unspecified dementia, moderate, with mood disturbance: Secondary | ICD-10-CM | POA: Diagnosis not present

## 2022-07-22 DIAGNOSIS — M6281 Muscle weakness (generalized): Secondary | ICD-10-CM | POA: Diagnosis not present

## 2022-07-22 DIAGNOSIS — R2689 Other abnormalities of gait and mobility: Secondary | ICD-10-CM | POA: Diagnosis not present

## 2022-07-24 DIAGNOSIS — G35 Multiple sclerosis: Secondary | ICD-10-CM | POA: Diagnosis not present

## 2022-07-24 DIAGNOSIS — W19XXXD Unspecified fall, subsequent encounter: Secondary | ICD-10-CM | POA: Diagnosis not present

## 2022-07-24 DIAGNOSIS — M1380 Other specified arthritis, unspecified site: Secondary | ICD-10-CM | POA: Diagnosis not present

## 2022-07-24 DIAGNOSIS — F03B3 Unspecified dementia, moderate, with mood disturbance: Secondary | ICD-10-CM | POA: Diagnosis not present

## 2022-07-24 DIAGNOSIS — M6281 Muscle weakness (generalized): Secondary | ICD-10-CM | POA: Diagnosis not present

## 2022-07-24 DIAGNOSIS — R2689 Other abnormalities of gait and mobility: Secondary | ICD-10-CM | POA: Diagnosis not present

## 2022-07-25 DIAGNOSIS — F03B3 Unspecified dementia, moderate, with mood disturbance: Secondary | ICD-10-CM | POA: Diagnosis not present

## 2022-07-25 DIAGNOSIS — M6281 Muscle weakness (generalized): Secondary | ICD-10-CM | POA: Diagnosis not present

## 2022-07-25 DIAGNOSIS — M1380 Other specified arthritis, unspecified site: Secondary | ICD-10-CM | POA: Diagnosis not present

## 2022-07-25 DIAGNOSIS — R2689 Other abnormalities of gait and mobility: Secondary | ICD-10-CM | POA: Diagnosis not present

## 2022-07-25 DIAGNOSIS — G35 Multiple sclerosis: Secondary | ICD-10-CM | POA: Diagnosis not present

## 2022-07-25 DIAGNOSIS — W19XXXD Unspecified fall, subsequent encounter: Secondary | ICD-10-CM | POA: Diagnosis not present

## 2022-07-29 DIAGNOSIS — W19XXXD Unspecified fall, subsequent encounter: Secondary | ICD-10-CM | POA: Diagnosis not present

## 2022-07-29 DIAGNOSIS — M6281 Muscle weakness (generalized): Secondary | ICD-10-CM | POA: Diagnosis not present

## 2022-07-29 DIAGNOSIS — R2689 Other abnormalities of gait and mobility: Secondary | ICD-10-CM | POA: Diagnosis not present

## 2022-07-29 DIAGNOSIS — M1380 Other specified arthritis, unspecified site: Secondary | ICD-10-CM | POA: Diagnosis not present

## 2022-07-29 DIAGNOSIS — G35 Multiple sclerosis: Secondary | ICD-10-CM | POA: Diagnosis not present

## 2022-07-29 DIAGNOSIS — F03B3 Unspecified dementia, moderate, with mood disturbance: Secondary | ICD-10-CM | POA: Diagnosis not present

## 2022-07-31 DIAGNOSIS — G35 Multiple sclerosis: Secondary | ICD-10-CM | POA: Diagnosis not present

## 2022-07-31 DIAGNOSIS — M6281 Muscle weakness (generalized): Secondary | ICD-10-CM | POA: Diagnosis not present

## 2022-07-31 DIAGNOSIS — W19XXXD Unspecified fall, subsequent encounter: Secondary | ICD-10-CM | POA: Diagnosis not present

## 2022-07-31 DIAGNOSIS — F03B3 Unspecified dementia, moderate, with mood disturbance: Secondary | ICD-10-CM | POA: Diagnosis not present

## 2022-07-31 DIAGNOSIS — M1380 Other specified arthritis, unspecified site: Secondary | ICD-10-CM | POA: Diagnosis not present

## 2022-07-31 DIAGNOSIS — R2689 Other abnormalities of gait and mobility: Secondary | ICD-10-CM | POA: Diagnosis not present

## 2022-08-01 DIAGNOSIS — M6281 Muscle weakness (generalized): Secondary | ICD-10-CM | POA: Diagnosis not present

## 2022-08-01 DIAGNOSIS — G35 Multiple sclerosis: Secondary | ICD-10-CM | POA: Diagnosis not present

## 2022-08-01 DIAGNOSIS — W19XXXD Unspecified fall, subsequent encounter: Secondary | ICD-10-CM | POA: Diagnosis not present

## 2022-08-01 DIAGNOSIS — M1380 Other specified arthritis, unspecified site: Secondary | ICD-10-CM | POA: Diagnosis not present

## 2022-08-01 DIAGNOSIS — F03B3 Unspecified dementia, moderate, with mood disturbance: Secondary | ICD-10-CM | POA: Diagnosis not present

## 2022-08-01 DIAGNOSIS — R2689 Other abnormalities of gait and mobility: Secondary | ICD-10-CM | POA: Diagnosis not present

## 2022-08-02 DIAGNOSIS — R2689 Other abnormalities of gait and mobility: Secondary | ICD-10-CM | POA: Diagnosis not present

## 2022-08-02 DIAGNOSIS — M6281 Muscle weakness (generalized): Secondary | ICD-10-CM | POA: Diagnosis not present

## 2022-08-02 DIAGNOSIS — G35 Multiple sclerosis: Secondary | ICD-10-CM | POA: Diagnosis not present

## 2022-08-02 DIAGNOSIS — M1380 Other specified arthritis, unspecified site: Secondary | ICD-10-CM | POA: Diagnosis not present

## 2022-08-02 DIAGNOSIS — F03B3 Unspecified dementia, moderate, with mood disturbance: Secondary | ICD-10-CM | POA: Diagnosis not present

## 2022-08-02 DIAGNOSIS — W19XXXD Unspecified fall, subsequent encounter: Secondary | ICD-10-CM | POA: Diagnosis not present

## 2022-08-06 DIAGNOSIS — W19XXXD Unspecified fall, subsequent encounter: Secondary | ICD-10-CM | POA: Diagnosis not present

## 2022-08-06 DIAGNOSIS — R2689 Other abnormalities of gait and mobility: Secondary | ICD-10-CM | POA: Diagnosis not present

## 2022-08-06 DIAGNOSIS — F03B3 Unspecified dementia, moderate, with mood disturbance: Secondary | ICD-10-CM | POA: Diagnosis not present

## 2022-08-06 DIAGNOSIS — M6281 Muscle weakness (generalized): Secondary | ICD-10-CM | POA: Diagnosis not present

## 2022-08-06 DIAGNOSIS — G35 Multiple sclerosis: Secondary | ICD-10-CM | POA: Diagnosis not present

## 2022-08-06 DIAGNOSIS — M1380 Other specified arthritis, unspecified site: Secondary | ICD-10-CM | POA: Diagnosis not present

## 2022-08-07 DIAGNOSIS — F03B3 Unspecified dementia, moderate, with mood disturbance: Secondary | ICD-10-CM | POA: Diagnosis not present

## 2022-08-07 DIAGNOSIS — R2689 Other abnormalities of gait and mobility: Secondary | ICD-10-CM | POA: Diagnosis not present

## 2022-08-07 DIAGNOSIS — W19XXXD Unspecified fall, subsequent encounter: Secondary | ICD-10-CM | POA: Diagnosis not present

## 2022-08-07 DIAGNOSIS — M6281 Muscle weakness (generalized): Secondary | ICD-10-CM | POA: Diagnosis not present

## 2022-08-07 DIAGNOSIS — G35 Multiple sclerosis: Secondary | ICD-10-CM | POA: Diagnosis not present

## 2022-08-07 DIAGNOSIS — M1380 Other specified arthritis, unspecified site: Secondary | ICD-10-CM | POA: Diagnosis not present

## 2022-08-08 DIAGNOSIS — M6281 Muscle weakness (generalized): Secondary | ICD-10-CM | POA: Diagnosis not present

## 2022-08-08 DIAGNOSIS — R2689 Other abnormalities of gait and mobility: Secondary | ICD-10-CM | POA: Diagnosis not present

## 2022-08-08 DIAGNOSIS — F03B3 Unspecified dementia, moderate, with mood disturbance: Secondary | ICD-10-CM | POA: Diagnosis not present

## 2022-08-08 DIAGNOSIS — M1380 Other specified arthritis, unspecified site: Secondary | ICD-10-CM | POA: Diagnosis not present

## 2022-08-08 DIAGNOSIS — W19XXXD Unspecified fall, subsequent encounter: Secondary | ICD-10-CM | POA: Diagnosis not present

## 2022-08-08 DIAGNOSIS — G35 Multiple sclerosis: Secondary | ICD-10-CM | POA: Diagnosis not present

## 2022-08-13 DIAGNOSIS — G35 Multiple sclerosis: Secondary | ICD-10-CM | POA: Diagnosis not present

## 2022-08-13 DIAGNOSIS — F03B3 Unspecified dementia, moderate, with mood disturbance: Secondary | ICD-10-CM | POA: Diagnosis not present

## 2022-08-13 DIAGNOSIS — R2689 Other abnormalities of gait and mobility: Secondary | ICD-10-CM | POA: Diagnosis not present

## 2022-08-13 DIAGNOSIS — M6281 Muscle weakness (generalized): Secondary | ICD-10-CM | POA: Diagnosis not present

## 2022-08-13 DIAGNOSIS — M1380 Other specified arthritis, unspecified site: Secondary | ICD-10-CM | POA: Diagnosis not present

## 2022-08-13 DIAGNOSIS — W19XXXD Unspecified fall, subsequent encounter: Secondary | ICD-10-CM | POA: Diagnosis not present

## 2022-08-16 DIAGNOSIS — M6281 Muscle weakness (generalized): Secondary | ICD-10-CM | POA: Diagnosis not present

## 2022-08-16 DIAGNOSIS — M1380 Other specified arthritis, unspecified site: Secondary | ICD-10-CM | POA: Diagnosis not present

## 2022-08-16 DIAGNOSIS — F03B3 Unspecified dementia, moderate, with mood disturbance: Secondary | ICD-10-CM | POA: Diagnosis not present

## 2022-08-16 DIAGNOSIS — W19XXXD Unspecified fall, subsequent encounter: Secondary | ICD-10-CM | POA: Diagnosis not present

## 2022-08-16 DIAGNOSIS — R2689 Other abnormalities of gait and mobility: Secondary | ICD-10-CM | POA: Diagnosis not present

## 2022-08-16 DIAGNOSIS — G35 Multiple sclerosis: Secondary | ICD-10-CM | POA: Diagnosis not present

## 2022-08-19 DIAGNOSIS — G35 Multiple sclerosis: Secondary | ICD-10-CM | POA: Diagnosis not present

## 2022-08-19 DIAGNOSIS — W19XXXD Unspecified fall, subsequent encounter: Secondary | ICD-10-CM | POA: Diagnosis not present

## 2022-08-19 DIAGNOSIS — R2689 Other abnormalities of gait and mobility: Secondary | ICD-10-CM | POA: Diagnosis not present

## 2022-08-19 DIAGNOSIS — M6281 Muscle weakness (generalized): Secondary | ICD-10-CM | POA: Diagnosis not present

## 2022-08-19 DIAGNOSIS — M1380 Other specified arthritis, unspecified site: Secondary | ICD-10-CM | POA: Diagnosis not present

## 2022-08-19 DIAGNOSIS — F03B3 Unspecified dementia, moderate, with mood disturbance: Secondary | ICD-10-CM | POA: Diagnosis not present

## 2022-08-27 DIAGNOSIS — M810 Age-related osteoporosis without current pathological fracture: Secondary | ICD-10-CM | POA: Diagnosis not present

## 2022-08-27 DIAGNOSIS — H26492 Other secondary cataract, left eye: Secondary | ICD-10-CM | POA: Diagnosis not present

## 2022-08-27 DIAGNOSIS — H35033 Hypertensive retinopathy, bilateral: Secondary | ICD-10-CM | POA: Diagnosis not present

## 2022-08-27 DIAGNOSIS — H35362 Drusen (degenerative) of macula, left eye: Secondary | ICD-10-CM | POA: Diagnosis not present

## 2022-10-05 ENCOUNTER — Emergency Department (HOSPITAL_COMMUNITY)
Admission: EM | Admit: 2022-10-05 | Discharge: 2022-10-05 | Disposition: A | Discharge status: Home / Self Care | Payer: Medicare Other | Attending: Emergency Medicine | Admitting: Emergency Medicine

## 2022-10-05 ENCOUNTER — Emergency Department (HOSPITAL_COMMUNITY): Payer: Medicare Other

## 2022-10-05 DIAGNOSIS — F0390 Unspecified dementia without behavioral disturbance: Secondary | ICD-10-CM | POA: Insufficient documentation

## 2022-10-05 DIAGNOSIS — Z79899 Other long term (current) drug therapy: Secondary | ICD-10-CM | POA: Insufficient documentation

## 2022-10-05 DIAGNOSIS — R5383 Other fatigue: Secondary | ICD-10-CM | POA: Insufficient documentation

## 2022-10-05 DIAGNOSIS — I10 Essential (primary) hypertension: Secondary | ICD-10-CM | POA: Insufficient documentation

## 2022-10-05 DIAGNOSIS — I2510 Atherosclerotic heart disease of native coronary artery without angina pectoris: Secondary | ICD-10-CM | POA: Diagnosis not present

## 2022-10-05 DIAGNOSIS — R4182 Altered mental status, unspecified: Secondary | ICD-10-CM | POA: Diagnosis not present

## 2022-10-05 DIAGNOSIS — J439 Emphysema, unspecified: Secondary | ICD-10-CM | POA: Diagnosis not present

## 2022-10-05 DIAGNOSIS — K449 Diaphragmatic hernia without obstruction or gangrene: Secondary | ICD-10-CM | POA: Diagnosis not present

## 2022-10-05 DIAGNOSIS — R0989 Other specified symptoms and signs involving the circulatory and respiratory systems: Secondary | ICD-10-CM | POA: Diagnosis not present

## 2022-10-05 DIAGNOSIS — D72829 Elevated white blood cell count, unspecified: Secondary | ICD-10-CM | POA: Diagnosis not present

## 2022-10-05 DIAGNOSIS — R410 Disorientation, unspecified: Secondary | ICD-10-CM | POA: Diagnosis not present

## 2022-10-05 DIAGNOSIS — N3090 Cystitis, unspecified without hematuria: Secondary | ICD-10-CM | POA: Insufficient documentation

## 2022-10-05 DIAGNOSIS — R531 Weakness: Secondary | ICD-10-CM | POA: Diagnosis not present

## 2022-10-05 DIAGNOSIS — R918 Other nonspecific abnormal finding of lung field: Secondary | ICD-10-CM | POA: Diagnosis not present

## 2022-10-05 DIAGNOSIS — Z91040 Latex allergy status: Secondary | ICD-10-CM | POA: Diagnosis not present

## 2022-10-05 DIAGNOSIS — R509 Fever, unspecified: Secondary | ICD-10-CM | POA: Diagnosis not present

## 2022-10-05 LAB — CBC WITH DIFFERENTIAL/PLATELET
Abs Immature Granulocytes: 0.07 10*3/uL (ref 0.00–0.07)
Basophils Absolute: 0 10*3/uL (ref 0.0–0.1)
Basophils Relative: 0 %
Eosinophils Absolute: 0 10*3/uL (ref 0.0–0.5)
Eosinophils Relative: 0 %
HCT: 41 % (ref 36.0–46.0)
Hemoglobin: 13 g/dL (ref 12.0–15.0)
Immature Granulocytes: 1 %
Lymphocytes Relative: 13 %
Lymphs Abs: 1.9 10*3/uL (ref 0.7–4.0)
MCH: 31 pg (ref 26.0–34.0)
MCHC: 31.7 g/dL (ref 30.0–36.0)
MCV: 97.6 fL (ref 80.0–100.0)
Monocytes Absolute: 1.5 10*3/uL — ABNORMAL HIGH (ref 0.1–1.0)
Monocytes Relative: 11 %
Neutro Abs: 10.9 10*3/uL — ABNORMAL HIGH (ref 1.7–7.7)
Neutrophils Relative %: 75 %
Platelets: 264 10*3/uL (ref 150–400)
RBC: 4.2 MIL/uL (ref 3.87–5.11)
RDW: 13.1 % (ref 11.5–15.5)
WBC: 14.4 10*3/uL — ABNORMAL HIGH (ref 4.0–10.5)
nRBC: 0 % (ref 0.0–0.2)

## 2022-10-05 LAB — COMPREHENSIVE METABOLIC PANEL
ALT: 21 U/L (ref 0–44)
AST: 25 U/L (ref 15–41)
Albumin: 3.5 g/dL (ref 3.5–5.0)
Alkaline Phosphatase: 55 U/L (ref 38–126)
Anion gap: 9 (ref 5–15)
BUN: 23 mg/dL (ref 8–23)
CO2: 22 mmol/L (ref 22–32)
Calcium: 8.4 mg/dL — ABNORMAL LOW (ref 8.9–10.3)
Chloride: 106 mmol/L (ref 98–111)
Creatinine, Ser: 0.7 mg/dL (ref 0.44–1.00)
GFR, Estimated: >60 mL/min (ref >60)
Glucose, Bld: 114 mg/dL — ABNORMAL HIGH (ref 70–99)
Potassium: 4.1 mmol/L (ref 3.5–5.1)
Sodium: 137 mmol/L (ref 135–145)
Total Bilirubin: 1 mg/dL (ref 0.3–1.2)
Total Protein: 6.2 g/dL — ABNORMAL LOW (ref 6.5–8.1)

## 2022-10-05 LAB — I-STAT CG4 LACTIC ACID, ED
Lactic Acid, Venous: 1.1 mmol/L (ref 0.5–1.9)
Lactic Acid, Venous: 1.2 mmol/L (ref 0.5–1.9)

## 2022-10-05 LAB — URINALYSIS, W/ REFLEX TO CULTURE (INFECTION SUSPECTED)
Bilirubin Urine: NEGATIVE
Glucose, UA: NEGATIVE mg/dL
Hgb urine dipstick: NEGATIVE
Ketones, ur: NEGATIVE mg/dL
Nitrite: NEGATIVE
Protein, ur: NEGATIVE mg/dL
Specific Gravity, Urine: 1.025 (ref 1.005–1.030)
pH: 6 (ref 5.0–8.0)

## 2022-10-05 MED ORDER — SODIUM CHLORIDE 0.9 % IV SOLN
500.0000 mg | Freq: Once | INTRAVENOUS | Status: AC
  Administered 2022-10-05: 500 mg via INTRAVENOUS
  Filled 2022-10-05: qty 5

## 2022-10-05 MED ORDER — IOHEXOL 350 MG/ML SOLN
75.0000 mL | Freq: Once | INTRAVENOUS | Status: AC | PRN
  Administered 2022-10-05: 75 mL via INTRAVENOUS

## 2022-10-05 MED ORDER — AZITHROMYCIN 250 MG PO TABS: 250.0000 mg | ORAL_TABLET | Freq: Every day | ORAL | 0 refills | Status: DC

## 2022-10-05 MED ORDER — SODIUM CHLORIDE 0.9 % IV SOLN
1.0000 g | Freq: Once | INTRAVENOUS | Status: AC
  Administered 2022-10-05: 1 g via INTRAVENOUS
  Filled 2022-10-05: qty 10

## 2022-10-05 MED ORDER — CEFPODOXIME PROXETIL 200 MG PO TABS: 200.0000 mg | ORAL_TABLET | Freq: Two times a day (BID) | ORAL | 0 refills | Status: AC

## 2022-10-05 NOTE — ED Notes (Signed)
Pt is going to x-ray from the waiting room, x-ray tech informed to take pt to room 4 after x-ray is done.

## 2022-10-05 NOTE — ED Triage Notes (Signed)
PT arrives via POV with her daughter. Pt resides at [facility] in the memory care unit. Daughter states Tuesday she was fatigued and less interactive than normal. She states she was doing better until today. Daughter is concerned she may have a UTI. Pt is AxOx3, which is her baseline.

## 2022-10-05 NOTE — Discharge Instructions (Addendum)
Prescription for antibiotic was sent to [facility] in [facility].  Take as prescribed starting tomorrow.  Return to the emergency department for any new or worsening symptoms of concern.

## 2022-10-05 NOTE — ED Notes (Signed)
CT scan notified that pt is ready with IV access.

## 2022-10-05 NOTE — ED Notes (Signed)
Pt cannot urinate at

## 2022-10-05 NOTE — ED Provider Notes (Signed)
[place] EMERGENCY DEPARTMENT AT [facility] Provider Note   CSN: [id number] Arrival date & time: 10/05/22  1525     History  Chief Complaint  Patient presents with   Fatigue   Possible UTI    Jennifer Atkins is a 88 y.o. female.  HPI Patient presents for fatigue.  Medical history includes MS, dementia, UC, HTN, arthritis.  She resides in memory care unit of nursing facility.  History is provided primarily by her daughter.  On Tuesday, patient seemed fatigued.  Daughter subsequently saw her yesterday, at which time she seemed better.  This morning, she stayed in bed, where typically she would get up and get dressed and go to breakfast.  Staff at the nursing facility told daughter that the patient had a "low-grade fever".  Specifics of this fever are unknown.  She was given Tylenol this morning.  Patient subsequently had some lunch.  Daughter states that she seems better now.  She is at her normal conversant states.  She initially went to urgent care and they were told that she had some crackles on lung auscultation.  They did not perform any testing.  They sent her to the ED for further evaluation.  Currently, patient states that she has some mild chest discomfort but is unable to specify where.  She denies any other areas of pain.  She denies any shortness of breath.    Home Medications Prior to Admission medications   Medication Sig Start Date End Date Taking? Authorizing Provider  acetaminophen (TYLENOL) 325 MG tablet Take 650 mg by mouth every 8 (eight) hours as needed for fever (pain).   Yes [provider]  acetaminophen (TYLENOL) 650 MG CR tablet Take 1,300 mg by mouth daily.   Yes [provider]  amLODipine (NORVASC) 2.5 MG tablet Take 2.5 mg by mouth daily.   Yes [provider]  Calcium Carbonate Antacid 400 MG CHEW Chew 400 mg by mouth See admin instructions. 400 mg twice daily, in the morning and afternoon   Yes [provider]   cefpodoxime (VANTIN) 200 MG tablet Take 1 tablet (200 mg total) by mouth 2 (two) times daily for 5 days. 10/05/22 10/10/22 Yes Gloris Manchester, MD  cholecalciferol (VITAMIN D3) 25 MCG (1000 UNIT) tablet Take 1,000 Units by mouth See admin instructions. 1000 units twice daily, in the morning and afternoon.   Yes [provider]  Glucosamine HCl 500 MG TABS Take 500 mg by mouth See admin instructions. 500 mg twice daily, in the morning and afternoon   Yes [provider]  mirabegron ER (MYRBETRIQ) 50 MG TB24 tablet Take 50 mg by mouth at bedtime.   Yes [provider]  Multiple Vitamins-Minerals (MULTIVITAMIN PO) Take 1 tablet by mouth daily.    Yes [provider]  omeprazole (PRILOSEC) 20 MG capsule Take 20 mg by mouth See admin instructions. 20 mg twice daily, in the morning and afternoon   Yes [provider]  QUEtiapine (SEROQUEL) 100 MG tablet Take 100 mg by mouth at bedtime. In addition to 25 mg, TDD of 125 mg at bedtime.   Yes [provider]  QUEtiapine (SEROQUEL) 25 MG tablet Take 25 mg by mouth at bedtime. In addition to 100mg , TDD 125mg  at bedtime.   Yes [provider]  Trospium Chloride 60 MG CP24 Take 60 mg by mouth daily.   Yes [provider]  valsartan (DIOVAN) 320 MG tablet Take 320 mg by mouth at bedtime.  Yes [provider]      Allergies    Latex    Review of Systems   Review of Systems  Unable to perform ROS: Dementia    Physical Exam Updated Vital Signs BP 139/65   Pulse 82   Temp 98.3 F (36.8 C) (Oral)   Resp 18   Ht 5\' 4"  (1.626 m)   Wt 67.1 kg   SpO2 100%   BMI 25.40 kg/m  Physical Exam Vitals and nursing note reviewed.  Constitutional:      General: She is not in acute distress.    Appearance: Normal appearance. She is well-developed. She is not ill-appearing, toxic-appearing or diaphoretic.  HENT:     Head: Normocephalic and atraumatic.     Right Ear: External ear normal.      Left Ear: External ear normal.     Nose: Nose normal.     Mouth/Throat:     Mouth: Mucous membranes are moist.  Eyes:     Extraocular Movements: Extraocular movements intact.     Conjunctiva/sclera: Conjunctivae normal.  Cardiovascular:     Rate and Rhythm: Normal rate and regular rhythm.     Heart sounds: No murmur heard. Pulmonary:     Effort: Pulmonary effort is normal. No respiratory distress.     Breath sounds: Normal breath sounds. No wheezing or rales.  Chest:     Chest wall: No tenderness.  Abdominal:     General: There is no distension.     Palpations: Abdomen is soft.     Tenderness: There is no abdominal tenderness.  Musculoskeletal:        General: No swelling. Normal range of motion.     Cervical back: Normal range of motion and neck supple.     Right lower leg: No edema.     Left lower leg: No edema.  Skin:    General: Skin is warm and dry.     Coloration: Skin is not jaundiced or pale.  Neurological:     General: No focal deficit present.     Mental Status: She is alert. Mental status is at baseline. She is disoriented.  Psychiatric:        Mood and Affect: Mood normal.        Behavior: Behavior normal.     ED Results / Procedures / Treatments   Labs (all labs ordered are listed, but only abnormal results are displayed) Labs Reviewed  COMPREHENSIVE METABOLIC PANEL - Abnormal; Notable for the following components:      Result Value   Glucose, Bld 114 (*)    Calcium 8.4 (*)    Total Protein 6.2 (*)    All other components within normal limits  CBC WITH DIFFERENTIAL/PLATELET - Abnormal; Notable for the following components:   WBC 14.4 (*)    Neutro Abs 10.9 (*)    Monocytes Absolute 1.5 (*)    All other components within normal limits  URINALYSIS, W/ REFLEX TO CULTURE (INFECTION SUSPECTED) - Abnormal; Notable for the following components:   Leukocytes,Ua SMALL (*)    Bacteria, UA RARE (*)    All other components within normal limits  URINE  CULTURE  I-STAT CG4 LACTIC ACID, ED  I-STAT CG4 LACTIC ACID, ED    EKG None  Radiology CT Angio Chest PE W and/or Wo Contrast  Result Date: 10/05/2022 CLINICAL DATA:  Fatigue, altered mental status EXAM: CT ANGIOGRAPHY CHEST WITH CONTRAST TECHNIQUE: Multidetector CT imaging of the chest was performed using the standard  protocol during bolus administration of intravenous contrast. Multiplanar CT image reconstructions and MIPs were obtained to evaluate the vascular anatomy. RADIATION DOSE REDUCTION: This exam was performed according to the departmental dose-optimization program which includes automated exposure control, adjustment of the mA and/or kV according to patient size and/or use of iterative reconstruction technique. CONTRAST:  75mL OMNIPAQUE IOHEXOL 350 MG/ML SOLN COMPARISON:  None Available. FINDINGS: Cardiovascular: Satisfactory opacification of the bilateral pulmonary arteries to the segmental level. No evidence of pulmonary embolism. Although not tailored for evaluation of the thoracic aorta, there is no evidence of aneurysm or dissection. Mild atherosclerotic calcifications of the aortic arch. Moderate coronary atherosclerosis of the LAD. Mediastinum/Nodes: No suspicious mediastinal lymphadenopathy. Visualized thyroid is unremarkable. Lungs/Pleura: Biapical pleural-parenchymal scarring. Mild centrilobular emphysematous changes, upper lung predominant. Mild dependent atelectasis in the bilateral lower lobes, right greater than left. No focal consolidation. No suspicious pulmonary nodules, noting motion degradation. No pleural effusion or pneumothorax. Upper Abdomen: Visualized upper abdomen is notable for a moderate hiatal hernia and vascular calcifications. Musculoskeletal: Visualized osseous structures are within normal limits. Review of the MIP images confirms the above findings. IMPRESSION: No evidence of pulmonary embolism. No acute cardiopulmonary disease. Aortic Atherosclerosis  (ICD10-I70.0) and Emphysema (ICD10-J43.9). Electronically Signed   By: Charline Bills M.D.   On: 10/05/2022 21:00   DG Chest 2 View  Result Date: 10/05/2022 CLINICAL DATA:  Infection EXAM: CHEST - 2 VIEW COMPARISON:  11/02/2013 FINDINGS: Heart size is normal. Moderate hiatal hernia. Interstitial airspace opacity of the lung bases. The visualized skeletal structures are unremarkable. IMPRESSION: 1. Interstitial airspace opacity of the lung bases, concerning for infection or aspiration, although possibly reflecting scarring/fibrosis. 2. Moderate hiatal hernia. Electronically Signed   By: Jearld Lesch M.D.   On: 10/05/2022 17:03    Procedures Procedures    Medications Ordered in ED Medications  azithromycin (ZITHROMAX) 500 mg in sodium chloride 0.9 % 250 mL IVPB (has no administration in time range)  cefTRIAXone (ROCEPHIN) 1 g in sodium chloride 0.9 % 100 mL IVPB (1 g Intravenous New Bag/Given 10/05/22 2019)  iohexol (OMNIPAQUE) 350 MG/ML injection 75 mL (75 mLs Intravenous Contrast Given 10/05/22 2047)    ED Course/ Medical Decision Making/ A&P                                 Medical Decision Making Amount and/or Complexity of Data Reviewed Labs: ordered. Radiology: ordered.  Risk Prescription drug management.   This patient presents to the ED for concern of fatigue, this involves an extensive number of treatment options, and is a complaint that carries with it a high risk of complications and morbidity.  The differential diagnosis includes infection, metabolic disturbances, deconditioning, progression of chronic illness, anemia, depression   Co morbidities that complicate the patient evaluation  MS, dementia, UC, HTN, arthritis   Additional history obtained:  Additional history obtained from patient's daughter External records from outside source obtained and reviewed including EMR   Lab Tests:  I Ordered, and personally interpreted labs.  The pertinent results include:  Hemoglobin is normal.  Leukocytosis is present.  Kidney function and electrolytes are normal.  Urinalysis shows evidence of infection.   Imaging Studies ordered:  I ordered imaging studies including chest x-ray, CTA chest I independently visualized and interpreted imaging which showed bibasilar opacities concerning for infection/for aspiration I agree with the radiologist interpretation   Cardiac Monitoring: / EKG:  The patient was maintained on  a cardiac monitor.  I personally viewed and interpreted the cardiac monitored which showed an underlying rhythm of: Sinus rhythm  Problem List / ED Course / Critical interventions / Medication management  Patient presents for fatigue.  This was noticed by her daughter 4 days ago as well as yesterday.  On arrival, patient's vital signs are normal.  She is well-appearing on exam.  She denies any abdominal tenderness.  Her breathing is currently unlabored.  Lungs seem clear to auscultation, although there was a concern to urgent care focal crackles.  Workup was initiated to identify possible source of infection causing her recent fatigue.  Serum lab work was notable for a leukocytosis.  Urinalysis does show evidence of infection.  Patient was started on ceftriaxone.  On chest x-ray, there is concern of bibasilar opacities.  Azithromycin was added to antibiotic regimen while in the ED.  On reassessment, patient is resting comfortably.  She denies any current complaints.  She underwent CTA which showed that bibasilar findings on x-ray are likely secondary to atelectasis.  Patient to be treated for UTI only.  Urine cultures were sent.  She was informed of workup results.  Given her well appearance, sustained normal vital signs, and minimal symptoms, patient is appropriate for outpatient therapy.  Antibiotics were prescribed.  Patient was discharged in stable condition. I ordered medication including ceftriaxone and azithromycin for UTI with concern of  pneumonia Reevaluation of the patient after these medicines showed that the patient improved I have reviewed the patients home medicines and have made adjustments as needed   Social Determinants of Health:  Resides in nursing facility         Final Clinical Impression(s) / ED Diagnoses Final diagnoses:  Cystitis  Fatigue, unspecified type    Rx / DC Orders ED Discharge Orders          Ordered    cefpodoxime (VANTIN) 200 MG tablet  2 times daily        10/05/22 2038    azithromycin (ZITHROMAX) 250 MG tablet  Daily,   Status:  Discontinued        10/05/22 2038              Gloris Manchester, MD 10/05/22 2109

## 2022-10-05 NOTE — ED Notes (Signed)
Pt up to bedside commode. Pt was able to urinate/provide urine sample.

## 2022-10-06 LAB — URINE CULTURE

## 2022-10-24 DIAGNOSIS — Z23 Encounter for immunization: Secondary | ICD-10-CM | POA: Diagnosis not present

## 2022-11-20 DIAGNOSIS — M546 Pain in thoracic spine: Secondary | ICD-10-CM | POA: Diagnosis not present

## 2022-11-20 DIAGNOSIS — M542 Cervicalgia: Secondary | ICD-10-CM | POA: Diagnosis not present

## 2022-11-20 DIAGNOSIS — M545 Low back pain, unspecified: Secondary | ICD-10-CM | POA: Diagnosis not present

## 2022-12-26 DIAGNOSIS — M1712 Unilateral primary osteoarthritis, left knee: Secondary | ICD-10-CM | POA: Diagnosis not present

## 2022-12-31 DIAGNOSIS — R5381 Other malaise: Secondary | ICD-10-CM | POA: Diagnosis not present

## 2023-01-02 DIAGNOSIS — R2689 Other abnormalities of gait and mobility: Secondary | ICD-10-CM | POA: Diagnosis not present

## 2023-01-02 DIAGNOSIS — M1712 Unilateral primary osteoarthritis, left knee: Secondary | ICD-10-CM | POA: Diagnosis not present

## 2023-01-02 DIAGNOSIS — M6281 Muscle weakness (generalized): Secondary | ICD-10-CM | POA: Diagnosis not present

## 2023-01-07 DIAGNOSIS — R2689 Other abnormalities of gait and mobility: Secondary | ICD-10-CM | POA: Diagnosis not present

## 2023-01-07 DIAGNOSIS — M6281 Muscle weakness (generalized): Secondary | ICD-10-CM | POA: Diagnosis not present

## 2023-01-09 DIAGNOSIS — R2689 Other abnormalities of gait and mobility: Secondary | ICD-10-CM | POA: Diagnosis not present

## 2023-01-09 DIAGNOSIS — M1712 Unilateral primary osteoarthritis, left knee: Secondary | ICD-10-CM | POA: Diagnosis not present

## 2023-01-09 DIAGNOSIS — M6281 Muscle weakness (generalized): Secondary | ICD-10-CM | POA: Diagnosis not present

## 2023-01-10 DIAGNOSIS — M47896 Other spondylosis, lumbar region: Secondary | ICD-10-CM | POA: Diagnosis not present

## 2023-01-13 DIAGNOSIS — R2689 Other abnormalities of gait and mobility: Secondary | ICD-10-CM | POA: Diagnosis not present

## 2023-01-13 DIAGNOSIS — M6281 Muscle weakness (generalized): Secondary | ICD-10-CM | POA: Diagnosis not present

## 2023-01-16 DIAGNOSIS — R2689 Other abnormalities of gait and mobility: Secondary | ICD-10-CM | POA: Diagnosis not present

## 2023-01-16 DIAGNOSIS — M6281 Muscle weakness (generalized): Secondary | ICD-10-CM | POA: Diagnosis not present

## 2023-01-17 DIAGNOSIS — M6281 Muscle weakness (generalized): Secondary | ICD-10-CM | POA: Diagnosis not present

## 2023-01-17 DIAGNOSIS — R2689 Other abnormalities of gait and mobility: Secondary | ICD-10-CM | POA: Diagnosis not present

## 2023-01-19 DIAGNOSIS — R2689 Other abnormalities of gait and mobility: Secondary | ICD-10-CM | POA: Diagnosis not present

## 2023-01-19 DIAGNOSIS — M6281 Muscle weakness (generalized): Secondary | ICD-10-CM | POA: Diagnosis not present

## 2023-01-20 DIAGNOSIS — R2689 Other abnormalities of gait and mobility: Secondary | ICD-10-CM | POA: Diagnosis not present

## 2023-01-20 DIAGNOSIS — M6281 Muscle weakness (generalized): Secondary | ICD-10-CM | POA: Diagnosis not present

## 2023-02-11 DIAGNOSIS — I10 Essential (primary) hypertension: Secondary | ICD-10-CM | POA: Diagnosis not present

## 2023-02-11 DIAGNOSIS — G35 Multiple sclerosis: Secondary | ICD-10-CM | POA: Diagnosis not present

## 2023-02-11 DIAGNOSIS — R269 Unspecified abnormalities of gait and mobility: Secondary | ICD-10-CM | POA: Diagnosis not present

## 2023-02-11 DIAGNOSIS — E785 Hyperlipidemia, unspecified: Secondary | ICD-10-CM | POA: Diagnosis not present

## 2023-02-11 DIAGNOSIS — Z87440 Personal history of urinary (tract) infections: Secondary | ICD-10-CM | POA: Diagnosis not present

## 2023-02-11 DIAGNOSIS — M810 Age-related osteoporosis without current pathological fracture: Secondary | ICD-10-CM | POA: Diagnosis not present

## 2023-02-11 DIAGNOSIS — F03911 Unspecified dementia, unspecified severity, with agitation: Secondary | ICD-10-CM | POA: Diagnosis not present

## 2023-02-11 DIAGNOSIS — M51369 Other intervertebral disc degeneration, lumbar region without mention of lumbar back pain or lower extremity pain: Secondary | ICD-10-CM | POA: Diagnosis not present

## 2023-02-11 DIAGNOSIS — R5381 Other malaise: Secondary | ICD-10-CM | POA: Diagnosis not present

## 2023-02-12 DIAGNOSIS — F03911 Unspecified dementia, unspecified severity, with agitation: Secondary | ICD-10-CM | POA: Diagnosis not present

## 2023-02-12 DIAGNOSIS — R5381 Other malaise: Secondary | ICD-10-CM | POA: Diagnosis not present

## 2023-02-12 DIAGNOSIS — M51369 Other intervertebral disc degeneration, lumbar region without mention of lumbar back pain or lower extremity pain: Secondary | ICD-10-CM | POA: Diagnosis not present

## 2023-02-12 DIAGNOSIS — G35 Multiple sclerosis: Secondary | ICD-10-CM | POA: Diagnosis not present

## 2023-02-12 DIAGNOSIS — Z9189 Other specified personal risk factors, not elsewhere classified: Secondary | ICD-10-CM | POA: Diagnosis not present

## 2023-02-12 DIAGNOSIS — M810 Age-related osteoporosis without current pathological fracture: Secondary | ICD-10-CM | POA: Diagnosis not present

## 2023-02-12 DIAGNOSIS — R269 Unspecified abnormalities of gait and mobility: Secondary | ICD-10-CM | POA: Diagnosis not present

## 2023-02-12 DIAGNOSIS — I872 Venous insufficiency (chronic) (peripheral): Secondary | ICD-10-CM | POA: Diagnosis not present

## 2023-02-12 DIAGNOSIS — R32 Unspecified urinary incontinence: Secondary | ICD-10-CM | POA: Diagnosis not present

## 2023-02-12 DIAGNOSIS — L89896 Pressure-induced deep tissue damage of other site: Secondary | ICD-10-CM | POA: Diagnosis not present

## 2023-02-12 DIAGNOSIS — I10 Essential (primary) hypertension: Secondary | ICD-10-CM | POA: Diagnosis not present

## 2023-02-13 DIAGNOSIS — F03B3 Unspecified dementia, moderate, with mood disturbance: Secondary | ICD-10-CM | POA: Diagnosis not present

## 2023-02-13 DIAGNOSIS — R2681 Unsteadiness on feet: Secondary | ICD-10-CM | POA: Diagnosis not present

## 2023-02-14 DIAGNOSIS — G311 Senile degeneration of brain, not elsewhere classified: Secondary | ICD-10-CM | POA: Diagnosis not present

## 2023-02-14 DIAGNOSIS — D649 Anemia, unspecified: Secondary | ICD-10-CM | POA: Diagnosis not present

## 2023-02-14 DIAGNOSIS — I10 Essential (primary) hypertension: Secondary | ICD-10-CM | POA: Diagnosis not present

## 2023-02-14 DIAGNOSIS — G35 Multiple sclerosis: Secondary | ICD-10-CM | POA: Diagnosis not present

## 2023-02-14 DIAGNOSIS — N3281 Overactive bladder: Secondary | ICD-10-CM | POA: Diagnosis not present

## 2023-02-14 DIAGNOSIS — M818 Other osteoporosis without current pathological fracture: Secondary | ICD-10-CM | POA: Diagnosis not present

## 2023-02-14 DIAGNOSIS — K219 Gastro-esophageal reflux disease without esophagitis: Secondary | ICD-10-CM | POA: Diagnosis not present

## 2023-02-14 DIAGNOSIS — F03911 Unspecified dementia, unspecified severity, with agitation: Secondary | ICD-10-CM | POA: Diagnosis not present

## 2023-02-15 DIAGNOSIS — F03911 Unspecified dementia, unspecified severity, with agitation: Secondary | ICD-10-CM | POA: Diagnosis not present

## 2023-02-15 DIAGNOSIS — G311 Senile degeneration of brain, not elsewhere classified: Secondary | ICD-10-CM | POA: Diagnosis not present

## 2023-02-15 DIAGNOSIS — M818 Other osteoporosis without current pathological fracture: Secondary | ICD-10-CM | POA: Diagnosis not present

## 2023-02-15 DIAGNOSIS — I10 Essential (primary) hypertension: Secondary | ICD-10-CM | POA: Diagnosis not present

## 2023-02-15 DIAGNOSIS — G35 Multiple sclerosis: Secondary | ICD-10-CM | POA: Diagnosis not present

## 2023-02-15 DIAGNOSIS — K219 Gastro-esophageal reflux disease without esophagitis: Secondary | ICD-10-CM | POA: Diagnosis not present

## 2023-02-17 DIAGNOSIS — F03911 Unspecified dementia, unspecified severity, with agitation: Secondary | ICD-10-CM | POA: Diagnosis not present

## 2023-02-17 DIAGNOSIS — M818 Other osteoporosis without current pathological fracture: Secondary | ICD-10-CM | POA: Diagnosis not present

## 2023-02-17 DIAGNOSIS — G311 Senile degeneration of brain, not elsewhere classified: Secondary | ICD-10-CM | POA: Diagnosis not present

## 2023-02-17 DIAGNOSIS — I10 Essential (primary) hypertension: Secondary | ICD-10-CM | POA: Diagnosis not present

## 2023-02-17 DIAGNOSIS — K219 Gastro-esophageal reflux disease without esophagitis: Secondary | ICD-10-CM | POA: Diagnosis not present

## 2023-02-17 DIAGNOSIS — G35 Multiple sclerosis: Secondary | ICD-10-CM | POA: Diagnosis not present

## 2023-02-17 DIAGNOSIS — L89623 Pressure ulcer of left heel, stage 3: Secondary | ICD-10-CM | POA: Diagnosis not present

## 2023-02-17 DIAGNOSIS — F03918 Unspecified dementia, unspecified severity, with other behavioral disturbance: Secondary | ICD-10-CM | POA: Diagnosis not present

## 2023-02-17 DIAGNOSIS — M6281 Muscle weakness (generalized): Secondary | ICD-10-CM | POA: Diagnosis not present

## 2023-02-18 DIAGNOSIS — I10 Essential (primary) hypertension: Secondary | ICD-10-CM | POA: Diagnosis not present

## 2023-02-18 DIAGNOSIS — K219 Gastro-esophageal reflux disease without esophagitis: Secondary | ICD-10-CM | POA: Diagnosis not present

## 2023-02-18 DIAGNOSIS — G311 Senile degeneration of brain, not elsewhere classified: Secondary | ICD-10-CM | POA: Diagnosis not present

## 2023-02-18 DIAGNOSIS — F03911 Unspecified dementia, unspecified severity, with agitation: Secondary | ICD-10-CM | POA: Diagnosis not present

## 2023-02-18 DIAGNOSIS — M818 Other osteoporosis without current pathological fracture: Secondary | ICD-10-CM | POA: Diagnosis not present

## 2023-02-18 DIAGNOSIS — G35 Multiple sclerosis: Secondary | ICD-10-CM | POA: Diagnosis not present

## 2023-02-20 DIAGNOSIS — G35 Multiple sclerosis: Secondary | ICD-10-CM | POA: Diagnosis not present

## 2023-02-20 DIAGNOSIS — I10 Essential (primary) hypertension: Secondary | ICD-10-CM | POA: Diagnosis not present

## 2023-02-20 DIAGNOSIS — F03911 Unspecified dementia, unspecified severity, with agitation: Secondary | ICD-10-CM | POA: Diagnosis not present

## 2023-02-20 DIAGNOSIS — K219 Gastro-esophageal reflux disease without esophagitis: Secondary | ICD-10-CM | POA: Diagnosis not present

## 2023-02-20 DIAGNOSIS — G311 Senile degeneration of brain, not elsewhere classified: Secondary | ICD-10-CM | POA: Diagnosis not present

## 2023-02-20 DIAGNOSIS — M818 Other osteoporosis without current pathological fracture: Secondary | ICD-10-CM | POA: Diagnosis not present

## 2023-02-22 DIAGNOSIS — M818 Other osteoporosis without current pathological fracture: Secondary | ICD-10-CM | POA: Diagnosis not present

## 2023-02-22 DIAGNOSIS — F03911 Unspecified dementia, unspecified severity, with agitation: Secondary | ICD-10-CM | POA: Diagnosis not present

## 2023-02-22 DIAGNOSIS — N3281 Overactive bladder: Secondary | ICD-10-CM | POA: Diagnosis not present

## 2023-02-22 DIAGNOSIS — G35 Multiple sclerosis: Secondary | ICD-10-CM | POA: Diagnosis not present

## 2023-02-22 DIAGNOSIS — I10 Essential (primary) hypertension: Secondary | ICD-10-CM | POA: Diagnosis not present

## 2023-02-22 DIAGNOSIS — G311 Senile degeneration of brain, not elsewhere classified: Secondary | ICD-10-CM | POA: Diagnosis not present

## 2023-02-22 DIAGNOSIS — K219 Gastro-esophageal reflux disease without esophagitis: Secondary | ICD-10-CM | POA: Diagnosis not present

## 2023-02-24 DIAGNOSIS — L89896 Pressure-induced deep tissue damage of other site: Secondary | ICD-10-CM | POA: Diagnosis not present

## 2023-02-24 DIAGNOSIS — M6281 Muscle weakness (generalized): Secondary | ICD-10-CM | POA: Diagnosis not present

## 2023-02-24 DIAGNOSIS — F03918 Unspecified dementia, unspecified severity, with other behavioral disturbance: Secondary | ICD-10-CM | POA: Diagnosis not present

## 2023-02-24 DIAGNOSIS — G35 Multiple sclerosis: Secondary | ICD-10-CM | POA: Diagnosis not present

## 2023-02-26 DIAGNOSIS — F03C2 Unspecified dementia, severe, with psychotic disturbance: Secondary | ICD-10-CM | POA: Diagnosis not present

## 2023-02-27 DIAGNOSIS — M818 Other osteoporosis without current pathological fracture: Secondary | ICD-10-CM | POA: Diagnosis not present

## 2023-02-27 DIAGNOSIS — G311 Senile degeneration of brain, not elsewhere classified: Secondary | ICD-10-CM | POA: Diagnosis not present

## 2023-02-27 DIAGNOSIS — G35 Multiple sclerosis: Secondary | ICD-10-CM | POA: Diagnosis not present

## 2023-02-27 DIAGNOSIS — I10 Essential (primary) hypertension: Secondary | ICD-10-CM | POA: Diagnosis not present

## 2023-02-27 DIAGNOSIS — F03911 Unspecified dementia, unspecified severity, with agitation: Secondary | ICD-10-CM | POA: Diagnosis not present

## 2023-02-27 DIAGNOSIS — K219 Gastro-esophageal reflux disease without esophagitis: Secondary | ICD-10-CM | POA: Diagnosis not present

## 2023-02-28 DIAGNOSIS — G35 Multiple sclerosis: Secondary | ICD-10-CM | POA: Diagnosis not present

## 2023-02-28 DIAGNOSIS — M818 Other osteoporosis without current pathological fracture: Secondary | ICD-10-CM | POA: Diagnosis not present

## 2023-02-28 DIAGNOSIS — K219 Gastro-esophageal reflux disease without esophagitis: Secondary | ICD-10-CM | POA: Diagnosis not present

## 2023-02-28 DIAGNOSIS — F03911 Unspecified dementia, unspecified severity, with agitation: Secondary | ICD-10-CM | POA: Diagnosis not present

## 2023-02-28 DIAGNOSIS — I10 Essential (primary) hypertension: Secondary | ICD-10-CM | POA: Diagnosis not present

## 2023-02-28 DIAGNOSIS — G311 Senile degeneration of brain, not elsewhere classified: Secondary | ICD-10-CM | POA: Diagnosis not present

## 2023-03-06 DIAGNOSIS — G311 Senile degeneration of brain, not elsewhere classified: Secondary | ICD-10-CM | POA: Diagnosis not present

## 2023-03-06 DIAGNOSIS — I10 Essential (primary) hypertension: Secondary | ICD-10-CM | POA: Diagnosis not present

## 2023-03-06 DIAGNOSIS — F03911 Unspecified dementia, unspecified severity, with agitation: Secondary | ICD-10-CM | POA: Diagnosis not present

## 2023-03-06 DIAGNOSIS — M818 Other osteoporosis without current pathological fracture: Secondary | ICD-10-CM | POA: Diagnosis not present

## 2023-03-06 DIAGNOSIS — K219 Gastro-esophageal reflux disease without esophagitis: Secondary | ICD-10-CM | POA: Diagnosis not present

## 2023-03-06 DIAGNOSIS — G35 Multiple sclerosis: Secondary | ICD-10-CM | POA: Diagnosis not present

## 2023-03-12 DIAGNOSIS — I10 Essential (primary) hypertension: Secondary | ICD-10-CM | POA: Diagnosis not present

## 2023-03-12 DIAGNOSIS — G311 Senile degeneration of brain, not elsewhere classified: Secondary | ICD-10-CM | POA: Diagnosis not present

## 2023-03-12 DIAGNOSIS — F0394 Unspecified dementia, unspecified severity, with anxiety: Secondary | ICD-10-CM | POA: Diagnosis not present

## 2023-03-12 DIAGNOSIS — R32 Unspecified urinary incontinence: Secondary | ICD-10-CM | POA: Diagnosis not present

## 2023-03-12 DIAGNOSIS — G35 Multiple sclerosis: Secondary | ICD-10-CM | POA: Diagnosis not present

## 2023-03-12 DIAGNOSIS — K219 Gastro-esophageal reflux disease without esophagitis: Secondary | ICD-10-CM | POA: Diagnosis not present

## 2023-03-12 DIAGNOSIS — F03911 Unspecified dementia, unspecified severity, with agitation: Secondary | ICD-10-CM | POA: Diagnosis not present

## 2023-03-12 DIAGNOSIS — F419 Anxiety disorder, unspecified: Secondary | ICD-10-CM | POA: Diagnosis not present

## 2023-03-12 DIAGNOSIS — M818 Other osteoporosis without current pathological fracture: Secondary | ICD-10-CM | POA: Diagnosis not present

## 2023-03-14 DIAGNOSIS — K219 Gastro-esophageal reflux disease without esophagitis: Secondary | ICD-10-CM | POA: Diagnosis not present

## 2023-03-14 DIAGNOSIS — M818 Other osteoporosis without current pathological fracture: Secondary | ICD-10-CM | POA: Diagnosis not present

## 2023-03-14 DIAGNOSIS — G311 Senile degeneration of brain, not elsewhere classified: Secondary | ICD-10-CM | POA: Diagnosis not present

## 2023-03-14 DIAGNOSIS — G35 Multiple sclerosis: Secondary | ICD-10-CM | POA: Diagnosis not present

## 2023-03-14 DIAGNOSIS — F03911 Unspecified dementia, unspecified severity, with agitation: Secondary | ICD-10-CM | POA: Diagnosis not present

## 2023-03-14 DIAGNOSIS — I10 Essential (primary) hypertension: Secondary | ICD-10-CM | POA: Diagnosis not present

## 2023-03-17 DIAGNOSIS — G35 Multiple sclerosis: Secondary | ICD-10-CM | POA: Diagnosis not present

## 2023-03-17 DIAGNOSIS — G311 Senile degeneration of brain, not elsewhere classified: Secondary | ICD-10-CM | POA: Diagnosis not present

## 2023-03-17 DIAGNOSIS — F03911 Unspecified dementia, unspecified severity, with agitation: Secondary | ICD-10-CM | POA: Diagnosis not present

## 2023-03-17 DIAGNOSIS — I10 Essential (primary) hypertension: Secondary | ICD-10-CM | POA: Diagnosis not present

## 2023-03-17 DIAGNOSIS — M818 Other osteoporosis without current pathological fracture: Secondary | ICD-10-CM | POA: Diagnosis not present

## 2023-03-17 DIAGNOSIS — K219 Gastro-esophageal reflux disease without esophagitis: Secondary | ICD-10-CM | POA: Diagnosis not present

## 2023-03-19 DIAGNOSIS — G35 Multiple sclerosis: Secondary | ICD-10-CM | POA: Diagnosis not present

## 2023-03-19 DIAGNOSIS — K219 Gastro-esophageal reflux disease without esophagitis: Secondary | ICD-10-CM | POA: Diagnosis not present

## 2023-03-19 DIAGNOSIS — F03911 Unspecified dementia, unspecified severity, with agitation: Secondary | ICD-10-CM | POA: Diagnosis not present

## 2023-03-19 DIAGNOSIS — I10 Essential (primary) hypertension: Secondary | ICD-10-CM | POA: Diagnosis not present

## 2023-03-19 DIAGNOSIS — M818 Other osteoporosis without current pathological fracture: Secondary | ICD-10-CM | POA: Diagnosis not present

## 2023-03-19 DIAGNOSIS — G311 Senile degeneration of brain, not elsewhere classified: Secondary | ICD-10-CM | POA: Diagnosis not present

## 2023-03-22 DIAGNOSIS — F03911 Unspecified dementia, unspecified severity, with agitation: Secondary | ICD-10-CM | POA: Diagnosis not present

## 2023-03-22 DIAGNOSIS — G311 Senile degeneration of brain, not elsewhere classified: Secondary | ICD-10-CM | POA: Diagnosis not present

## 2023-03-22 DIAGNOSIS — K219 Gastro-esophageal reflux disease without esophagitis: Secondary | ICD-10-CM | POA: Diagnosis not present

## 2023-03-22 DIAGNOSIS — M818 Other osteoporosis without current pathological fracture: Secondary | ICD-10-CM | POA: Diagnosis not present

## 2023-03-22 DIAGNOSIS — G35 Multiple sclerosis: Secondary | ICD-10-CM | POA: Diagnosis not present

## 2023-03-22 DIAGNOSIS — I10 Essential (primary) hypertension: Secondary | ICD-10-CM | POA: Diagnosis not present

## 2023-03-22 DIAGNOSIS — N3281 Overactive bladder: Secondary | ICD-10-CM | POA: Diagnosis not present

## 2023-03-24 DIAGNOSIS — M51369 Other intervertebral disc degeneration, lumbar region without mention of lumbar back pain or lower extremity pain: Secondary | ICD-10-CM | POA: Diagnosis not present

## 2023-03-24 DIAGNOSIS — L89616 Pressure-induced deep tissue damage of right heel: Secondary | ICD-10-CM | POA: Diagnosis not present

## 2023-03-24 DIAGNOSIS — I10 Essential (primary) hypertension: Secondary | ICD-10-CM | POA: Diagnosis not present

## 2023-03-24 DIAGNOSIS — M6281 Muscle weakness (generalized): Secondary | ICD-10-CM | POA: Diagnosis not present

## 2023-03-24 DIAGNOSIS — R5381 Other malaise: Secondary | ICD-10-CM | POA: Diagnosis not present

## 2023-03-24 DIAGNOSIS — R32 Unspecified urinary incontinence: Secondary | ICD-10-CM | POA: Diagnosis not present

## 2023-03-24 DIAGNOSIS — G35 Multiple sclerosis: Secondary | ICD-10-CM | POA: Diagnosis not present

## 2023-03-24 DIAGNOSIS — F03918 Unspecified dementia, unspecified severity, with other behavioral disturbance: Secondary | ICD-10-CM | POA: Diagnosis not present

## 2023-03-24 DIAGNOSIS — I872 Venous insufficiency (chronic) (peripheral): Secondary | ICD-10-CM | POA: Diagnosis not present

## 2023-03-24 DIAGNOSIS — F02811 Dementia in other diseases classified elsewhere, unspecified severity, with agitation: Secondary | ICD-10-CM | POA: Diagnosis not present

## 2023-03-24 DIAGNOSIS — M810 Age-related osteoporosis without current pathological fracture: Secondary | ICD-10-CM | POA: Diagnosis not present

## 2023-03-24 DIAGNOSIS — R269 Unspecified abnormalities of gait and mobility: Secondary | ICD-10-CM | POA: Diagnosis not present

## 2023-03-24 DIAGNOSIS — L89896 Pressure-induced deep tissue damage of other site: Secondary | ICD-10-CM | POA: Diagnosis not present

## 2023-03-27 DIAGNOSIS — G311 Senile degeneration of brain, not elsewhere classified: Secondary | ICD-10-CM | POA: Diagnosis not present

## 2023-03-27 DIAGNOSIS — G35 Multiple sclerosis: Secondary | ICD-10-CM | POA: Diagnosis not present

## 2023-03-27 DIAGNOSIS — F03911 Unspecified dementia, unspecified severity, with agitation: Secondary | ICD-10-CM | POA: Diagnosis not present

## 2023-03-27 DIAGNOSIS — M818 Other osteoporosis without current pathological fracture: Secondary | ICD-10-CM | POA: Diagnosis not present

## 2023-03-27 DIAGNOSIS — I10 Essential (primary) hypertension: Secondary | ICD-10-CM | POA: Diagnosis not present

## 2023-03-27 DIAGNOSIS — K219 Gastro-esophageal reflux disease without esophagitis: Secondary | ICD-10-CM | POA: Diagnosis not present

## 2023-04-01 DIAGNOSIS — M818 Other osteoporosis without current pathological fracture: Secondary | ICD-10-CM | POA: Diagnosis not present

## 2023-04-01 DIAGNOSIS — G311 Senile degeneration of brain, not elsewhere classified: Secondary | ICD-10-CM | POA: Diagnosis not present

## 2023-04-01 DIAGNOSIS — K219 Gastro-esophageal reflux disease without esophagitis: Secondary | ICD-10-CM | POA: Diagnosis not present

## 2023-04-01 DIAGNOSIS — F03911 Unspecified dementia, unspecified severity, with agitation: Secondary | ICD-10-CM | POA: Diagnosis not present

## 2023-04-01 DIAGNOSIS — G35 Multiple sclerosis: Secondary | ICD-10-CM | POA: Diagnosis not present

## 2023-04-01 DIAGNOSIS — I10 Essential (primary) hypertension: Secondary | ICD-10-CM | POA: Diagnosis not present

## 2023-04-02 DIAGNOSIS — G35 Multiple sclerosis: Secondary | ICD-10-CM | POA: Diagnosis not present

## 2023-04-02 DIAGNOSIS — I10 Essential (primary) hypertension: Secondary | ICD-10-CM | POA: Diagnosis not present

## 2023-04-02 DIAGNOSIS — F03911 Unspecified dementia, unspecified severity, with agitation: Secondary | ICD-10-CM | POA: Diagnosis not present

## 2023-04-02 DIAGNOSIS — G311 Senile degeneration of brain, not elsewhere classified: Secondary | ICD-10-CM | POA: Diagnosis not present

## 2023-04-02 DIAGNOSIS — K219 Gastro-esophageal reflux disease without esophagitis: Secondary | ICD-10-CM | POA: Diagnosis not present

## 2023-04-02 DIAGNOSIS — M818 Other osteoporosis without current pathological fracture: Secondary | ICD-10-CM | POA: Diagnosis not present

## 2023-04-04 DIAGNOSIS — M818 Other osteoporosis without current pathological fracture: Secondary | ICD-10-CM | POA: Diagnosis not present

## 2023-04-04 DIAGNOSIS — G311 Senile degeneration of brain, not elsewhere classified: Secondary | ICD-10-CM | POA: Diagnosis not present

## 2023-04-04 DIAGNOSIS — K219 Gastro-esophageal reflux disease without esophagitis: Secondary | ICD-10-CM | POA: Diagnosis not present

## 2023-04-04 DIAGNOSIS — G35 Multiple sclerosis: Secondary | ICD-10-CM | POA: Diagnosis not present

## 2023-04-04 DIAGNOSIS — F03911 Unspecified dementia, unspecified severity, with agitation: Secondary | ICD-10-CM | POA: Diagnosis not present

## 2023-04-04 DIAGNOSIS — I10 Essential (primary) hypertension: Secondary | ICD-10-CM | POA: Diagnosis not present

## 2023-04-07 DIAGNOSIS — M6281 Muscle weakness (generalized): Secondary | ICD-10-CM | POA: Diagnosis not present

## 2023-04-07 DIAGNOSIS — F03918 Unspecified dementia, unspecified severity, with other behavioral disturbance: Secondary | ICD-10-CM | POA: Diagnosis not present

## 2023-04-07 DIAGNOSIS — R5381 Other malaise: Secondary | ICD-10-CM | POA: Diagnosis not present

## 2023-04-07 DIAGNOSIS — L988 Other specified disorders of the skin and subcutaneous tissue: Secondary | ICD-10-CM | POA: Diagnosis not present

## 2023-04-07 DIAGNOSIS — L89154 Pressure ulcer of sacral region, stage 4: Secondary | ICD-10-CM | POA: Diagnosis not present

## 2023-04-07 DIAGNOSIS — L89890 Pressure ulcer of other site, unstageable: Secondary | ICD-10-CM | POA: Diagnosis not present

## 2023-04-07 DIAGNOSIS — L98492 Non-pressure chronic ulcer of skin of other sites with fat layer exposed: Secondary | ICD-10-CM | POA: Diagnosis not present

## 2023-04-07 DIAGNOSIS — I959 Hypotension, unspecified: Secondary | ICD-10-CM | POA: Diagnosis not present

## 2023-04-08 DIAGNOSIS — G35 Multiple sclerosis: Secondary | ICD-10-CM | POA: Diagnosis not present

## 2023-04-08 DIAGNOSIS — F03911 Unspecified dementia, unspecified severity, with agitation: Secondary | ICD-10-CM | POA: Diagnosis not present

## 2023-04-08 DIAGNOSIS — G311 Senile degeneration of brain, not elsewhere classified: Secondary | ICD-10-CM | POA: Diagnosis not present

## 2023-04-08 DIAGNOSIS — M818 Other osteoporosis without current pathological fracture: Secondary | ICD-10-CM | POA: Diagnosis not present

## 2023-04-08 DIAGNOSIS — I10 Essential (primary) hypertension: Secondary | ICD-10-CM | POA: Diagnosis not present

## 2023-04-08 DIAGNOSIS — K219 Gastro-esophageal reflux disease without esophagitis: Secondary | ICD-10-CM | POA: Diagnosis not present

## 2023-04-09 DIAGNOSIS — K219 Gastro-esophageal reflux disease without esophagitis: Secondary | ICD-10-CM | POA: Diagnosis not present

## 2023-04-09 DIAGNOSIS — I10 Essential (primary) hypertension: Secondary | ICD-10-CM | POA: Diagnosis not present

## 2023-04-09 DIAGNOSIS — M818 Other osteoporosis without current pathological fracture: Secondary | ICD-10-CM | POA: Diagnosis not present

## 2023-04-09 DIAGNOSIS — F03911 Unspecified dementia, unspecified severity, with agitation: Secondary | ICD-10-CM | POA: Diagnosis not present

## 2023-04-09 DIAGNOSIS — G311 Senile degeneration of brain, not elsewhere classified: Secondary | ICD-10-CM | POA: Diagnosis not present

## 2023-04-09 DIAGNOSIS — G35 Multiple sclerosis: Secondary | ICD-10-CM | POA: Diagnosis not present

## 2023-04-14 DIAGNOSIS — L97412 Non-pressure chronic ulcer of right heel and midfoot with fat layer exposed: Secondary | ICD-10-CM | POA: Diagnosis not present

## 2023-04-14 DIAGNOSIS — F03918 Unspecified dementia, unspecified severity, with other behavioral disturbance: Secondary | ICD-10-CM | POA: Diagnosis not present

## 2023-04-14 DIAGNOSIS — L98493 Non-pressure chronic ulcer of skin of other sites with necrosis of muscle: Secondary | ICD-10-CM | POA: Diagnosis not present

## 2023-04-14 DIAGNOSIS — M6281 Muscle weakness (generalized): Secondary | ICD-10-CM | POA: Diagnosis not present

## 2023-04-14 DIAGNOSIS — L988 Other specified disorders of the skin and subcutaneous tissue: Secondary | ICD-10-CM | POA: Diagnosis not present

## 2023-04-15 DIAGNOSIS — I10 Essential (primary) hypertension: Secondary | ICD-10-CM | POA: Diagnosis not present

## 2023-04-15 DIAGNOSIS — K219 Gastro-esophageal reflux disease without esophagitis: Secondary | ICD-10-CM | POA: Diagnosis not present

## 2023-04-15 DIAGNOSIS — G311 Senile degeneration of brain, not elsewhere classified: Secondary | ICD-10-CM | POA: Diagnosis not present

## 2023-04-15 DIAGNOSIS — F03911 Unspecified dementia, unspecified severity, with agitation: Secondary | ICD-10-CM | POA: Diagnosis not present

## 2023-04-15 DIAGNOSIS — M818 Other osteoporosis without current pathological fracture: Secondary | ICD-10-CM | POA: Diagnosis not present

## 2023-04-15 DIAGNOSIS — G35 Multiple sclerosis: Secondary | ICD-10-CM | POA: Diagnosis not present

## 2023-04-16 DIAGNOSIS — I10 Essential (primary) hypertension: Secondary | ICD-10-CM | POA: Diagnosis not present

## 2023-04-16 DIAGNOSIS — K219 Gastro-esophageal reflux disease without esophagitis: Secondary | ICD-10-CM | POA: Diagnosis not present

## 2023-04-16 DIAGNOSIS — M818 Other osteoporosis without current pathological fracture: Secondary | ICD-10-CM | POA: Diagnosis not present

## 2023-04-16 DIAGNOSIS — G35 Multiple sclerosis: Secondary | ICD-10-CM | POA: Diagnosis not present

## 2023-04-16 DIAGNOSIS — F03911 Unspecified dementia, unspecified severity, with agitation: Secondary | ICD-10-CM | POA: Diagnosis not present

## 2023-04-16 DIAGNOSIS — G311 Senile degeneration of brain, not elsewhere classified: Secondary | ICD-10-CM | POA: Diagnosis not present

## 2023-04-17 DIAGNOSIS — G35 Multiple sclerosis: Secondary | ICD-10-CM | POA: Diagnosis not present

## 2023-04-17 DIAGNOSIS — K219 Gastro-esophageal reflux disease without esophagitis: Secondary | ICD-10-CM | POA: Diagnosis not present

## 2023-04-17 DIAGNOSIS — I10 Essential (primary) hypertension: Secondary | ICD-10-CM | POA: Diagnosis not present

## 2023-04-17 DIAGNOSIS — F03911 Unspecified dementia, unspecified severity, with agitation: Secondary | ICD-10-CM | POA: Diagnosis not present

## 2023-04-17 DIAGNOSIS — M818 Other osteoporosis without current pathological fracture: Secondary | ICD-10-CM | POA: Diagnosis not present

## 2023-04-17 DIAGNOSIS — G311 Senile degeneration of brain, not elsewhere classified: Secondary | ICD-10-CM | POA: Diagnosis not present

## 2023-04-18 DIAGNOSIS — I10 Essential (primary) hypertension: Secondary | ICD-10-CM | POA: Diagnosis not present

## 2023-04-18 DIAGNOSIS — M818 Other osteoporosis without current pathological fracture: Secondary | ICD-10-CM | POA: Diagnosis not present

## 2023-04-18 DIAGNOSIS — G311 Senile degeneration of brain, not elsewhere classified: Secondary | ICD-10-CM | POA: Diagnosis not present

## 2023-04-18 DIAGNOSIS — G35 Multiple sclerosis: Secondary | ICD-10-CM | POA: Diagnosis not present

## 2023-04-18 DIAGNOSIS — F03911 Unspecified dementia, unspecified severity, with agitation: Secondary | ICD-10-CM | POA: Diagnosis not present

## 2023-04-18 DIAGNOSIS — K219 Gastro-esophageal reflux disease without esophagitis: Secondary | ICD-10-CM | POA: Diagnosis not present

## 2023-04-19 DIAGNOSIS — F03911 Unspecified dementia, unspecified severity, with agitation: Secondary | ICD-10-CM | POA: Diagnosis not present

## 2023-04-19 DIAGNOSIS — G35 Multiple sclerosis: Secondary | ICD-10-CM | POA: Diagnosis not present

## 2023-04-19 DIAGNOSIS — M818 Other osteoporosis without current pathological fracture: Secondary | ICD-10-CM | POA: Diagnosis not present

## 2023-04-19 DIAGNOSIS — G311 Senile degeneration of brain, not elsewhere classified: Secondary | ICD-10-CM | POA: Diagnosis not present

## 2023-04-19 DIAGNOSIS — K219 Gastro-esophageal reflux disease without esophagitis: Secondary | ICD-10-CM | POA: Diagnosis not present

## 2023-04-19 DIAGNOSIS — I10 Essential (primary) hypertension: Secondary | ICD-10-CM | POA: Diagnosis not present

## 2023-04-20 DIAGNOSIS — I10 Essential (primary) hypertension: Secondary | ICD-10-CM | POA: Diagnosis not present

## 2023-04-20 DIAGNOSIS — F03911 Unspecified dementia, unspecified severity, with agitation: Secondary | ICD-10-CM | POA: Diagnosis not present

## 2023-04-20 DIAGNOSIS — G35 Multiple sclerosis: Secondary | ICD-10-CM | POA: Diagnosis not present

## 2023-04-20 DIAGNOSIS — G311 Senile degeneration of brain, not elsewhere classified: Secondary | ICD-10-CM | POA: Diagnosis not present

## 2023-04-20 DIAGNOSIS — K219 Gastro-esophageal reflux disease without esophagitis: Secondary | ICD-10-CM | POA: Diagnosis not present

## 2023-04-20 DIAGNOSIS — M818 Other osteoporosis without current pathological fracture: Secondary | ICD-10-CM | POA: Diagnosis not present

## 2023-04-21 DIAGNOSIS — F03911 Unspecified dementia, unspecified severity, with agitation: Secondary | ICD-10-CM | POA: Diagnosis not present

## 2023-04-21 DIAGNOSIS — G311 Senile degeneration of brain, not elsewhere classified: Secondary | ICD-10-CM | POA: Diagnosis not present

## 2023-04-21 DIAGNOSIS — M818 Other osteoporosis without current pathological fracture: Secondary | ICD-10-CM | POA: Diagnosis not present

## 2023-04-21 DIAGNOSIS — G35 Multiple sclerosis: Secondary | ICD-10-CM | POA: Diagnosis not present

## 2023-04-21 DIAGNOSIS — K219 Gastro-esophageal reflux disease without esophagitis: Secondary | ICD-10-CM | POA: Diagnosis not present

## 2023-04-21 DIAGNOSIS — I10 Essential (primary) hypertension: Secondary | ICD-10-CM | POA: Diagnosis not present

## 2023-04-22 DIAGNOSIS — K219 Gastro-esophageal reflux disease without esophagitis: Secondary | ICD-10-CM | POA: Diagnosis not present

## 2023-04-22 DIAGNOSIS — G35 Multiple sclerosis: Secondary | ICD-10-CM | POA: Diagnosis not present

## 2023-04-22 DIAGNOSIS — F03911 Unspecified dementia, unspecified severity, with agitation: Secondary | ICD-10-CM | POA: Diagnosis not present

## 2023-04-22 DIAGNOSIS — G311 Senile degeneration of brain, not elsewhere classified: Secondary | ICD-10-CM | POA: Diagnosis not present

## 2023-04-22 DIAGNOSIS — N3281 Overactive bladder: Secondary | ICD-10-CM | POA: Diagnosis not present

## 2023-04-22 DIAGNOSIS — I10 Essential (primary) hypertension: Secondary | ICD-10-CM | POA: Diagnosis not present

## 2023-04-22 DIAGNOSIS — M818 Other osteoporosis without current pathological fracture: Secondary | ICD-10-CM | POA: Diagnosis not present

## 2023-04-23 DIAGNOSIS — G35 Multiple sclerosis: Secondary | ICD-10-CM | POA: Diagnosis not present

## 2023-04-23 DIAGNOSIS — I10 Essential (primary) hypertension: Secondary | ICD-10-CM | POA: Diagnosis not present

## 2023-04-23 DIAGNOSIS — K219 Gastro-esophageal reflux disease without esophagitis: Secondary | ICD-10-CM | POA: Diagnosis not present

## 2023-04-23 DIAGNOSIS — F03911 Unspecified dementia, unspecified severity, with agitation: Secondary | ICD-10-CM | POA: Diagnosis not present

## 2023-04-23 DIAGNOSIS — M818 Other osteoporosis without current pathological fracture: Secondary | ICD-10-CM | POA: Diagnosis not present

## 2023-04-23 DIAGNOSIS — G311 Senile degeneration of brain, not elsewhere classified: Secondary | ICD-10-CM | POA: Diagnosis not present

## 2023-04-24 DIAGNOSIS — G35 Multiple sclerosis: Secondary | ICD-10-CM | POA: Diagnosis not present

## 2023-04-24 DIAGNOSIS — M818 Other osteoporosis without current pathological fracture: Secondary | ICD-10-CM | POA: Diagnosis not present

## 2023-04-24 DIAGNOSIS — K219 Gastro-esophageal reflux disease without esophagitis: Secondary | ICD-10-CM | POA: Diagnosis not present

## 2023-04-24 DIAGNOSIS — G311 Senile degeneration of brain, not elsewhere classified: Secondary | ICD-10-CM | POA: Diagnosis not present

## 2023-04-24 DIAGNOSIS — I10 Essential (primary) hypertension: Secondary | ICD-10-CM | POA: Diagnosis not present

## 2023-04-24 DIAGNOSIS — F03911 Unspecified dementia, unspecified severity, with agitation: Secondary | ICD-10-CM | POA: Diagnosis not present

## 2023-04-25 DIAGNOSIS — M818 Other osteoporosis without current pathological fracture: Secondary | ICD-10-CM | POA: Diagnosis not present

## 2023-04-25 DIAGNOSIS — G35 Multiple sclerosis: Secondary | ICD-10-CM | POA: Diagnosis not present

## 2023-04-25 DIAGNOSIS — I10 Essential (primary) hypertension: Secondary | ICD-10-CM | POA: Diagnosis not present

## 2023-04-25 DIAGNOSIS — K219 Gastro-esophageal reflux disease without esophagitis: Secondary | ICD-10-CM | POA: Diagnosis not present

## 2023-04-25 DIAGNOSIS — G311 Senile degeneration of brain, not elsewhere classified: Secondary | ICD-10-CM | POA: Diagnosis not present

## 2023-04-25 DIAGNOSIS — F03911 Unspecified dementia, unspecified severity, with agitation: Secondary | ICD-10-CM | POA: Diagnosis not present

## 2023-04-26 DIAGNOSIS — I10 Essential (primary) hypertension: Secondary | ICD-10-CM | POA: Diagnosis not present

## 2023-04-26 DIAGNOSIS — K219 Gastro-esophageal reflux disease without esophagitis: Secondary | ICD-10-CM | POA: Diagnosis not present

## 2023-04-26 DIAGNOSIS — G35 Multiple sclerosis: Secondary | ICD-10-CM | POA: Diagnosis not present

## 2023-04-26 DIAGNOSIS — F03911 Unspecified dementia, unspecified severity, with agitation: Secondary | ICD-10-CM | POA: Diagnosis not present

## 2023-04-26 DIAGNOSIS — G311 Senile degeneration of brain, not elsewhere classified: Secondary | ICD-10-CM | POA: Diagnosis not present

## 2023-04-26 DIAGNOSIS — M818 Other osteoporosis without current pathological fracture: Secondary | ICD-10-CM | POA: Diagnosis not present

## 2023-04-27 DIAGNOSIS — G311 Senile degeneration of brain, not elsewhere classified: Secondary | ICD-10-CM | POA: Diagnosis not present

## 2023-04-27 DIAGNOSIS — K219 Gastro-esophageal reflux disease without esophagitis: Secondary | ICD-10-CM | POA: Diagnosis not present

## 2023-04-27 DIAGNOSIS — G35 Multiple sclerosis: Secondary | ICD-10-CM | POA: Diagnosis not present

## 2023-04-27 DIAGNOSIS — F03911 Unspecified dementia, unspecified severity, with agitation: Secondary | ICD-10-CM | POA: Diagnosis not present

## 2023-04-27 DIAGNOSIS — I10 Essential (primary) hypertension: Secondary | ICD-10-CM | POA: Diagnosis not present

## 2023-04-27 DIAGNOSIS — M818 Other osteoporosis without current pathological fracture: Secondary | ICD-10-CM | POA: Diagnosis not present

## 2023-05-22 DEATH — deceased
